# Patient Record
Sex: Female | Born: 1940 | Race: White | Hispanic: No | Marital: Married | Smoking: Former smoker
Health system: Southern US, Community
[De-identification: ages and names within clinical notes are randomized; demographics above are authoritative.]

## PROBLEM LIST (undated history)

## (undated) DIAGNOSIS — N3090 Cystitis, unspecified without hematuria: Secondary | ICD-10-CM

## (undated) DIAGNOSIS — K2270 Barrett's esophagus without dysplasia: Secondary | ICD-10-CM

## (undated) DIAGNOSIS — Z1389 Encounter for screening for other disorder: Secondary | ICD-10-CM

## (undated) DIAGNOSIS — M810 Age-related osteoporosis without current pathological fracture: Secondary | ICD-10-CM

## (undated) DIAGNOSIS — J449 Chronic obstructive pulmonary disease, unspecified: Secondary | ICD-10-CM

## (undated) DIAGNOSIS — M5126 Other intervertebral disc displacement, lumbar region: Secondary | ICD-10-CM

## (undated) DIAGNOSIS — K219 Gastro-esophageal reflux disease without esophagitis: Secondary | ICD-10-CM

## (undated) DIAGNOSIS — J45909 Unspecified asthma, uncomplicated: Secondary | ICD-10-CM

## (undated) DIAGNOSIS — I10 Essential (primary) hypertension: Secondary | ICD-10-CM

## (undated) DIAGNOSIS — Z1239 Encounter for other screening for malignant neoplasm of breast: Secondary | ICD-10-CM

## (undated) DIAGNOSIS — M51369 Other intervertebral disc degeneration, lumbar region without mention of lumbar back pain or lower extremity pain: Secondary | ICD-10-CM

## (undated) DIAGNOSIS — M419 Scoliosis, unspecified: Secondary | ICD-10-CM

## (undated) DIAGNOSIS — M5136 Other intervertebral disc degeneration, lumbar region: Secondary | ICD-10-CM

## (undated) DIAGNOSIS — D235 Other benign neoplasm of skin of trunk: Secondary | ICD-10-CM

## (undated) DIAGNOSIS — L409 Psoriasis, unspecified: Secondary | ICD-10-CM

## (undated) DIAGNOSIS — G4730 Sleep apnea, unspecified: Secondary | ICD-10-CM

## (undated) DIAGNOSIS — S83209A Unspecified tear of unspecified meniscus, current injury, unspecified knee, initial encounter: Secondary | ICD-10-CM

## (undated) DIAGNOSIS — M1990 Unspecified osteoarthritis, unspecified site: Secondary | ICD-10-CM

## (undated) HISTORY — DX: Age-related osteoporosis without current pathological fracture: M81.0

## (undated) HISTORY — DX: Scoliosis, unspecified: M41.9

## (undated) HISTORY — DX: Encounter for other screening for malignant neoplasm of breast: Z12.39

## (undated) HISTORY — DX: Chronic obstructive pulmonary disease, unspecified: J44.9

## (undated) HISTORY — DX: Unspecified asthma, uncomplicated: J45.909

## (undated) HISTORY — PX: COLONOSCOPY: SHX174

## (undated) HISTORY — DX: Other intervertebral disc degeneration, lumbar region without mention of lumbar back pain or lower extremity pain: M51.369

## (undated) HISTORY — DX: Psoriasis, unspecified: L40.9

## (undated) HISTORY — DX: Gastro-esophageal reflux disease without esophagitis: K21.9

## (undated) HISTORY — DX: Other intervertebral disc degeneration, lumbar region: M51.36

## (undated) HISTORY — DX: Unspecified osteoarthritis, unspecified site: M19.90

## (undated) HISTORY — DX: Other intervertebral disc displacement, lumbar region: M51.26

## (undated) HISTORY — DX: Cystitis, unspecified without hematuria: N30.90

## (undated) HISTORY — DX: Sleep apnea, unspecified: G47.30

## (undated) HISTORY — DX: Other benign neoplasm of skin of trunk: D23.5

## (undated) HISTORY — DX: Barrett's esophagus without dysplasia: K22.70

## (undated) HISTORY — PX: TOTAL ABDOMINAL HYSTERECTOMY: SHX209

## (undated) HISTORY — DX: Morbid (severe) obesity due to excess calories: E66.01

## (undated) HISTORY — DX: Essential (primary) hypertension: I10

## (undated) HISTORY — DX: Unspecified tear of unspecified meniscus, current injury, unspecified knee, initial encounter: S83.209A

## (undated) HISTORY — PX: TONSILLECTOMY: SUR1361

## (undated) HISTORY — PX: FOOT SURGERY: SHX648

## (undated) HISTORY — DX: Encounter for screening for other disorder: Z13.89

---

## 1994-02-22 HISTORY — PX: ABDOMINAL HYSTERECTOMY: SHX81

## 1994-02-22 HISTORY — PX: SALPINGOOPHORECTOMY: SHX82

## 1996-02-23 HISTORY — PX: BREAST BIOPSY: SHX20

## 2003-12-17 ENCOUNTER — Ambulatory Visit: Payer: Self-pay | Admitting: Pain Medicine

## 2004-01-13 ENCOUNTER — Ambulatory Visit: Payer: Self-pay | Admitting: Pain Medicine

## 2004-02-10 ENCOUNTER — Ambulatory Visit: Payer: Self-pay | Admitting: Internal Medicine

## 2004-02-25 ENCOUNTER — Ambulatory Visit: Payer: Self-pay | Admitting: Pain Medicine

## 2004-02-27 ENCOUNTER — Ambulatory Visit: Payer: Self-pay | Admitting: Pain Medicine

## 2004-03-02 ENCOUNTER — Ambulatory Visit: Payer: Self-pay | Admitting: Pain Medicine

## 2004-03-04 ENCOUNTER — Ambulatory Visit: Payer: Self-pay | Admitting: Pain Medicine

## 2004-03-19 ENCOUNTER — Ambulatory Visit: Payer: Self-pay | Admitting: Internal Medicine

## 2004-03-24 ENCOUNTER — Ambulatory Visit: Payer: Self-pay | Admitting: Pain Medicine

## 2004-03-25 ENCOUNTER — Ambulatory Visit: Payer: Self-pay | Admitting: Internal Medicine

## 2004-04-01 ENCOUNTER — Ambulatory Visit: Payer: Self-pay | Admitting: Pain Medicine

## 2004-04-01 ENCOUNTER — Ambulatory Visit: Payer: Self-pay | Admitting: Unknown Physician Specialty

## 2004-04-06 ENCOUNTER — Ambulatory Visit: Payer: Self-pay | Admitting: Unknown Physician Specialty

## 2004-04-13 ENCOUNTER — Ambulatory Visit: Payer: Self-pay | Admitting: Unknown Physician Specialty

## 2004-04-22 ENCOUNTER — Ambulatory Visit: Payer: Self-pay | Admitting: Internal Medicine

## 2004-04-27 ENCOUNTER — Ambulatory Visit: Payer: Self-pay | Admitting: General Surgery

## 2004-04-30 ENCOUNTER — Ambulatory Visit: Payer: Self-pay | Admitting: Pain Medicine

## 2004-04-30 ENCOUNTER — Ambulatory Visit: Payer: Self-pay | Admitting: General Surgery

## 2004-05-04 ENCOUNTER — Ambulatory Visit: Payer: Self-pay | Admitting: Pain Medicine

## 2004-05-28 ENCOUNTER — Ambulatory Visit: Payer: Self-pay | Admitting: Pain Medicine

## 2004-06-25 ENCOUNTER — Ambulatory Visit: Payer: Self-pay | Admitting: Pain Medicine

## 2004-07-23 ENCOUNTER — Ambulatory Visit: Payer: Self-pay | Admitting: Pain Medicine

## 2004-08-10 ENCOUNTER — Ambulatory Visit: Payer: Self-pay | Admitting: Pain Medicine

## 2004-09-17 ENCOUNTER — Ambulatory Visit: Payer: Self-pay | Admitting: Pain Medicine

## 2004-10-13 ENCOUNTER — Ambulatory Visit: Payer: Self-pay | Admitting: Ophthalmology

## 2004-10-15 ENCOUNTER — Ambulatory Visit: Payer: Self-pay | Admitting: Pain Medicine

## 2004-10-21 ENCOUNTER — Ambulatory Visit: Payer: Self-pay | Admitting: Ophthalmology

## 2004-11-19 ENCOUNTER — Ambulatory Visit: Payer: Self-pay | Admitting: Pain Medicine

## 2004-11-24 ENCOUNTER — Ambulatory Visit: Payer: Self-pay | Admitting: Ophthalmology

## 2004-12-02 ENCOUNTER — Ambulatory Visit: Payer: Self-pay | Admitting: Ophthalmology

## 2004-12-17 ENCOUNTER — Ambulatory Visit: Payer: Self-pay | Admitting: Pain Medicine

## 2005-01-18 ENCOUNTER — Ambulatory Visit: Payer: Self-pay | Admitting: Internal Medicine

## 2005-01-26 ENCOUNTER — Ambulatory Visit: Payer: Self-pay | Admitting: Pain Medicine

## 2005-01-28 ENCOUNTER — Ambulatory Visit: Payer: Self-pay | Admitting: General Surgery

## 2005-03-15 ENCOUNTER — Ambulatory Visit: Payer: Self-pay | Admitting: Pain Medicine

## 2005-04-13 ENCOUNTER — Ambulatory Visit: Payer: Self-pay | Admitting: Pain Medicine

## 2005-04-19 ENCOUNTER — Ambulatory Visit: Payer: Self-pay | Admitting: Pain Medicine

## 2005-04-27 ENCOUNTER — Ambulatory Visit: Payer: Self-pay | Admitting: General Surgery

## 2005-05-11 ENCOUNTER — Ambulatory Visit: Payer: Self-pay | Admitting: Pain Medicine

## 2005-07-08 ENCOUNTER — Ambulatory Visit: Payer: Self-pay | Admitting: Pain Medicine

## 2005-08-05 ENCOUNTER — Ambulatory Visit: Payer: Self-pay | Admitting: Pain Medicine

## 2005-08-10 ENCOUNTER — Encounter: Payer: Self-pay | Admitting: Pain Medicine

## 2005-08-16 ENCOUNTER — Ambulatory Visit: Payer: Self-pay | Admitting: Pain Medicine

## 2005-08-22 ENCOUNTER — Encounter: Payer: Self-pay | Admitting: Pain Medicine

## 2005-08-31 ENCOUNTER — Ambulatory Visit: Payer: Self-pay | Admitting: Pain Medicine

## 2005-09-24 ENCOUNTER — Encounter: Payer: Self-pay | Admitting: Pain Medicine

## 2005-09-28 ENCOUNTER — Ambulatory Visit: Payer: Self-pay | Admitting: Pain Medicine

## 2005-10-26 ENCOUNTER — Ambulatory Visit: Payer: Self-pay | Admitting: Pain Medicine

## 2005-11-03 ENCOUNTER — Ambulatory Visit: Payer: Self-pay | Admitting: Pain Medicine

## 2005-12-02 ENCOUNTER — Ambulatory Visit: Payer: Self-pay | Admitting: Pain Medicine

## 2005-12-13 ENCOUNTER — Ambulatory Visit: Payer: Self-pay | Admitting: Pain Medicine

## 2006-01-18 ENCOUNTER — Ambulatory Visit: Payer: Self-pay | Admitting: Pain Medicine

## 2006-01-31 ENCOUNTER — Ambulatory Visit: Payer: Self-pay | Admitting: Pain Medicine

## 2006-03-17 ENCOUNTER — Ambulatory Visit: Payer: Self-pay | Admitting: Pain Medicine

## 2006-04-05 ENCOUNTER — Ambulatory Visit: Payer: Self-pay | Admitting: Pain Medicine

## 2006-04-25 ENCOUNTER — Ambulatory Visit: Payer: Self-pay | Admitting: Pain Medicine

## 2006-06-28 ENCOUNTER — Ambulatory Visit: Payer: Self-pay | Admitting: Pain Medicine

## 2006-07-11 ENCOUNTER — Ambulatory Visit: Payer: Self-pay | Admitting: Pain Medicine

## 2006-08-02 ENCOUNTER — Ambulatory Visit: Payer: Self-pay | Admitting: Pain Medicine

## 2006-08-10 ENCOUNTER — Ambulatory Visit: Payer: Self-pay | Admitting: Pain Medicine

## 2006-08-22 ENCOUNTER — Ambulatory Visit: Payer: Self-pay | Admitting: General Surgery

## 2006-09-15 ENCOUNTER — Ambulatory Visit: Payer: Self-pay | Admitting: Pain Medicine

## 2006-09-21 ENCOUNTER — Ambulatory Visit: Payer: Self-pay | Admitting: Pain Medicine

## 2006-09-24 ENCOUNTER — Ambulatory Visit: Payer: Self-pay | Admitting: Pain Medicine

## 2006-09-26 ENCOUNTER — Ambulatory Visit: Payer: Self-pay | Admitting: Pain Medicine

## 2006-10-18 ENCOUNTER — Ambulatory Visit: Payer: Self-pay | Admitting: Pain Medicine

## 2006-11-16 ENCOUNTER — Ambulatory Visit: Payer: Self-pay | Admitting: Pain Medicine

## 2006-11-30 ENCOUNTER — Ambulatory Visit: Payer: Self-pay | Admitting: Psychiatry

## 2006-12-13 ENCOUNTER — Ambulatory Visit: Payer: Self-pay | Admitting: Pain Medicine

## 2006-12-26 ENCOUNTER — Ambulatory Visit: Payer: Self-pay | Admitting: Pain Medicine

## 2007-02-07 ENCOUNTER — Ambulatory Visit: Payer: Self-pay | Admitting: Pain Medicine

## 2007-03-01 ENCOUNTER — Ambulatory Visit: Payer: Self-pay | Admitting: Pain Medicine

## 2007-04-06 ENCOUNTER — Ambulatory Visit: Payer: Self-pay | Admitting: Pain Medicine

## 2007-05-01 ENCOUNTER — Ambulatory Visit: Payer: Self-pay | Admitting: Pain Medicine

## 2007-05-30 ENCOUNTER — Ambulatory Visit: Payer: Self-pay | Admitting: Pain Medicine

## 2007-06-21 ENCOUNTER — Ambulatory Visit: Payer: Self-pay | Admitting: Pain Medicine

## 2007-07-27 ENCOUNTER — Ambulatory Visit: Payer: Self-pay | Admitting: Pain Medicine

## 2007-08-17 ENCOUNTER — Ambulatory Visit: Payer: Self-pay | Admitting: Unknown Physician Specialty

## 2007-08-28 ENCOUNTER — Ambulatory Visit: Payer: Self-pay | Admitting: Pain Medicine

## 2007-10-03 ENCOUNTER — Ambulatory Visit: Payer: Self-pay | Admitting: Pain Medicine

## 2007-10-16 ENCOUNTER — Ambulatory Visit: Payer: Self-pay | Admitting: Pain Medicine

## 2007-10-31 ENCOUNTER — Ambulatory Visit: Payer: Self-pay | Admitting: Pain Medicine

## 2007-12-07 ENCOUNTER — Ambulatory Visit: Payer: Self-pay | Admitting: Internal Medicine

## 2007-12-26 ENCOUNTER — Ambulatory Visit: Payer: Self-pay | Admitting: Pain Medicine

## 2008-01-08 ENCOUNTER — Ambulatory Visit: Payer: Self-pay | Admitting: Pain Medicine

## 2008-01-25 ENCOUNTER — Ambulatory Visit: Payer: Self-pay | Admitting: Pain Medicine

## 2008-02-20 ENCOUNTER — Ambulatory Visit: Payer: Self-pay | Admitting: Pain Medicine

## 2008-02-20 ENCOUNTER — Ambulatory Visit: Payer: Self-pay | Admitting: Neurology

## 2008-02-29 ENCOUNTER — Ambulatory Visit: Payer: Self-pay | Admitting: Neurology

## 2008-03-21 ENCOUNTER — Ambulatory Visit: Payer: Self-pay | Admitting: Pain Medicine

## 2008-04-18 ENCOUNTER — Ambulatory Visit: Payer: Self-pay | Admitting: Pain Medicine

## 2008-05-09 ENCOUNTER — Ambulatory Visit: Payer: Self-pay | Admitting: Neurology

## 2008-05-21 ENCOUNTER — Ambulatory Visit: Payer: Self-pay | Admitting: Pain Medicine

## 2008-05-29 ENCOUNTER — Ambulatory Visit: Payer: Self-pay | Admitting: Pain Medicine

## 2008-06-13 ENCOUNTER — Ambulatory Visit: Payer: Self-pay | Admitting: Neurology

## 2008-06-15 ENCOUNTER — Ambulatory Visit: Payer: Self-pay | Admitting: Unknown Physician Specialty

## 2008-06-18 ENCOUNTER — Ambulatory Visit: Payer: Self-pay | Admitting: Pain Medicine

## 2008-06-24 ENCOUNTER — Ambulatory Visit: Payer: Self-pay | Admitting: Pain Medicine

## 2008-07-12 ENCOUNTER — Ambulatory Visit: Payer: Self-pay | Admitting: General Surgery

## 2008-07-18 ENCOUNTER — Ambulatory Visit: Payer: Self-pay | Admitting: Pain Medicine

## 2008-07-19 ENCOUNTER — Ambulatory Visit: Payer: Self-pay | Admitting: General Surgery

## 2008-08-15 ENCOUNTER — Ambulatory Visit: Payer: Self-pay | Admitting: Pain Medicine

## 2008-09-02 ENCOUNTER — Ambulatory Visit: Payer: Self-pay | Admitting: Pain Medicine

## 2008-11-05 ENCOUNTER — Ambulatory Visit: Payer: Self-pay | Admitting: Pain Medicine

## 2008-12-05 ENCOUNTER — Ambulatory Visit: Payer: Self-pay | Admitting: Pain Medicine

## 2008-12-10 ENCOUNTER — Emergency Department: Payer: Self-pay | Admitting: Emergency Medicine

## 2008-12-31 ENCOUNTER — Ambulatory Visit: Payer: Self-pay | Admitting: Pain Medicine

## 2009-01-06 ENCOUNTER — Ambulatory Visit: Payer: Self-pay | Admitting: Pain Medicine

## 2009-01-29 ENCOUNTER — Ambulatory Visit: Payer: Self-pay | Admitting: Pain Medicine

## 2009-01-30 ENCOUNTER — Ambulatory Visit: Payer: Self-pay

## 2009-02-18 ENCOUNTER — Encounter: Payer: Self-pay | Admitting: Physician Assistant

## 2009-03-11 ENCOUNTER — Ambulatory Visit: Payer: Self-pay | Admitting: Pain Medicine

## 2009-03-14 ENCOUNTER — Ambulatory Visit: Payer: Self-pay | Admitting: General Surgery

## 2009-04-23 ENCOUNTER — Ambulatory Visit: Payer: Self-pay | Admitting: Pain Medicine

## 2009-05-08 ENCOUNTER — Ambulatory Visit: Payer: Self-pay | Admitting: Cardiovascular Disease

## 2009-06-17 ENCOUNTER — Ambulatory Visit: Payer: Self-pay | Admitting: Pain Medicine

## 2009-07-22 ENCOUNTER — Ambulatory Visit: Payer: Self-pay | Admitting: Pain Medicine

## 2009-08-06 ENCOUNTER — Ambulatory Visit: Payer: Self-pay | Admitting: Internal Medicine

## 2009-09-09 ENCOUNTER — Ambulatory Visit: Payer: Self-pay | Admitting: Pain Medicine

## 2009-10-07 ENCOUNTER — Ambulatory Visit: Payer: Self-pay | Admitting: Pain Medicine

## 2009-10-10 ENCOUNTER — Ambulatory Visit: Payer: Self-pay | Admitting: General Surgery

## 2009-11-05 ENCOUNTER — Ambulatory Visit: Payer: Self-pay | Admitting: Pain Medicine

## 2009-12-23 ENCOUNTER — Ambulatory Visit: Payer: Self-pay | Admitting: Pain Medicine

## 2010-01-22 ENCOUNTER — Ambulatory Visit: Payer: Self-pay | Admitting: Pain Medicine

## 2010-02-22 HISTORY — PX: UPPER GI ENDOSCOPY: SHX6162

## 2010-02-24 ENCOUNTER — Ambulatory Visit: Payer: Self-pay | Admitting: Pain Medicine

## 2010-02-24 ENCOUNTER — Ambulatory Visit: Payer: Self-pay | Admitting: General Surgery

## 2010-03-26 ENCOUNTER — Ambulatory Visit: Payer: Self-pay | Admitting: Pain Medicine

## 2010-04-23 ENCOUNTER — Ambulatory Visit: Payer: Self-pay | Admitting: Pain Medicine

## 2010-05-28 ENCOUNTER — Ambulatory Visit: Payer: Self-pay | Admitting: Pain Medicine

## 2010-06-25 ENCOUNTER — Ambulatory Visit: Payer: Self-pay | Admitting: Pain Medicine

## 2010-07-23 ENCOUNTER — Ambulatory Visit: Payer: Self-pay | Admitting: Pain Medicine

## 2010-08-25 ENCOUNTER — Ambulatory Visit: Payer: Self-pay | Admitting: Pain Medicine

## 2010-09-02 ENCOUNTER — Ambulatory Visit: Payer: Self-pay | Admitting: Pain Medicine

## 2010-09-29 ENCOUNTER — Ambulatory Visit: Payer: Self-pay | Admitting: Pain Medicine

## 2010-10-27 ENCOUNTER — Ambulatory Visit: Payer: Self-pay | Admitting: Pain Medicine

## 2010-11-26 ENCOUNTER — Ambulatory Visit: Payer: Self-pay | Admitting: Pain Medicine

## 2010-12-02 ENCOUNTER — Ambulatory Visit: Payer: Self-pay | Admitting: Gastroenterology

## 2010-12-04 LAB — PATHOLOGY REPORT

## 2010-12-24 ENCOUNTER — Ambulatory Visit: Payer: Self-pay | Admitting: Pain Medicine

## 2011-01-26 ENCOUNTER — Ambulatory Visit: Payer: Self-pay | Admitting: Pain Medicine

## 2011-02-25 ENCOUNTER — Ambulatory Visit: Payer: Self-pay | Admitting: Pain Medicine

## 2011-04-05 ENCOUNTER — Ambulatory Visit: Payer: Self-pay | Admitting: Pain Medicine

## 2011-04-14 ENCOUNTER — Ambulatory Visit: Payer: Self-pay | Admitting: Gastroenterology

## 2011-04-16 LAB — PATHOLOGY REPORT

## 2011-05-11 ENCOUNTER — Ambulatory Visit: Payer: Self-pay | Admitting: Pain Medicine

## 2011-05-14 ENCOUNTER — Ambulatory Visit: Payer: Self-pay | Admitting: General Surgery

## 2011-06-10 ENCOUNTER — Ambulatory Visit: Payer: Self-pay | Admitting: Pain Medicine

## 2011-06-28 ENCOUNTER — Ambulatory Visit: Payer: Self-pay | Admitting: Pain Medicine

## 2011-07-26 ENCOUNTER — Ambulatory Visit: Payer: Self-pay | Admitting: Pain Medicine

## 2011-08-10 ENCOUNTER — Ambulatory Visit: Payer: Self-pay | Admitting: Pain Medicine

## 2011-09-08 ENCOUNTER — Ambulatory Visit: Payer: Self-pay | Admitting: Pain Medicine

## 2011-09-15 ENCOUNTER — Ambulatory Visit: Payer: Self-pay | Admitting: Bariatrics

## 2011-09-15 DIAGNOSIS — I10 Essential (primary) hypertension: Secondary | ICD-10-CM

## 2011-09-15 LAB — CBC WITH DIFFERENTIAL/PLATELET
Basophil #: 0.1 10*3/uL (ref 0.0–0.1)
Eosinophil #: 0.3 10*3/uL (ref 0.0–0.7)
Eosinophil %: 2.5 %
HGB: 12.7 g/dL (ref 12.0–16.0)
Lymphocyte #: 1.7 10*3/uL (ref 1.0–3.6)
MCH: 30.5 pg (ref 26.0–34.0)
MCV: 97 fL (ref 80–100)
Monocyte #: 0.8 x10 3/mm (ref 0.2–0.9)
Monocyte %: 7.8 %
Neutrophil #: 7.2 10*3/uL — ABNORMAL HIGH (ref 1.4–6.5)
Platelet: 336 10*3/uL (ref 150–440)
RBC: 4.18 10*6/uL (ref 3.80–5.20)
WBC: 10.1 10*3/uL (ref 3.6–11.0)

## 2011-09-15 LAB — PROTIME-INR
INR: 0.8
Prothrombin Time: 11.3 secs — ABNORMAL LOW (ref 11.5–14.7)

## 2011-09-15 LAB — COMPREHENSIVE METABOLIC PANEL
Albumin: 3.6 g/dL (ref 3.4–5.0)
Anion Gap: 10 (ref 7–16)
BUN: 17 mg/dL (ref 7–18)
Calcium, Total: 9.1 mg/dL (ref 8.5–10.1)
Chloride: 95 mmol/L — ABNORMAL LOW (ref 98–107)
EGFR (African American): >60
EGFR (Non-African Amer.): >60
Glucose: 116 mg/dL — ABNORMAL HIGH (ref 65–99)
Osmolality: 265 (ref 275–301)
Potassium: 4.4 mmol/L (ref 3.5–5.1)
SGOT(AST): 23 U/L (ref 15–37)
SGPT (ALT): 28 U/L
Total Protein: 7.3 g/dL (ref 6.4–8.2)

## 2011-09-15 LAB — PHOSPHORUS: Phosphorus: 3.5 mg/dL (ref 2.5–4.9)

## 2011-09-15 LAB — FERRITIN: Ferritin (ARMC): 14 ng/mL (ref 8–388)

## 2011-09-15 LAB — MAGNESIUM: Magnesium: 1.9 mg/dL

## 2011-09-15 LAB — AMYLASE: Amylase: 42 U/L (ref 25–115)

## 2011-09-15 LAB — HEMOGLOBIN A1C: Hemoglobin A1C: 6.3 % (ref 4.2–6.3)

## 2011-09-15 LAB — TSH: Thyroid Stimulating Horm: 1.27 u[IU]/mL

## 2011-09-15 LAB — IRON AND TIBC
Iron Saturation: 24 %
Unbound Iron-Bind.Cap.: 303 ug/dL

## 2011-09-15 LAB — LIPASE, BLOOD: Lipase: 70 U/L — ABNORMAL LOW (ref 73–393)

## 2011-10-14 ENCOUNTER — Ambulatory Visit: Payer: Self-pay | Admitting: Pain Medicine

## 2011-10-20 ENCOUNTER — Ambulatory Visit: Payer: Self-pay | Admitting: Pain Medicine

## 2011-10-21 ENCOUNTER — Ambulatory Visit: Payer: Self-pay | Admitting: Bariatrics

## 2011-10-24 ENCOUNTER — Ambulatory Visit: Payer: Self-pay | Admitting: Bariatrics

## 2011-11-11 ENCOUNTER — Ambulatory Visit: Payer: Self-pay | Admitting: Pain Medicine

## 2011-12-01 ENCOUNTER — Ambulatory Visit: Payer: Self-pay | Admitting: Pain Medicine

## 2012-01-12 ENCOUNTER — Ambulatory Visit: Payer: Self-pay | Admitting: Pain Medicine

## 2012-02-02 ENCOUNTER — Ambulatory Visit: Payer: Self-pay | Admitting: Pain Medicine

## 2012-02-23 DIAGNOSIS — S83209A Unspecified tear of unspecified meniscus, current injury, unspecified knee, initial encounter: Secondary | ICD-10-CM

## 2012-02-23 HISTORY — DX: Unspecified tear of unspecified meniscus, current injury, unspecified knee, initial encounter: S83.209A

## 2012-03-02 ENCOUNTER — Ambulatory Visit: Payer: Self-pay | Admitting: Pain Medicine

## 2012-03-09 ENCOUNTER — Ambulatory Visit: Payer: Self-pay | Admitting: Pain Medicine

## 2012-03-12 ENCOUNTER — Emergency Department: Payer: Self-pay | Admitting: Internal Medicine

## 2012-03-31 ENCOUNTER — Encounter: Payer: Self-pay | Admitting: General Surgery

## 2012-04-11 ENCOUNTER — Ambulatory Visit: Payer: Self-pay | Admitting: Pain Medicine

## 2012-05-11 ENCOUNTER — Ambulatory Visit: Payer: Self-pay | Admitting: Pain Medicine

## 2012-05-25 ENCOUNTER — Ambulatory Visit: Payer: Self-pay | Admitting: General Surgery

## 2012-05-29 ENCOUNTER — Encounter: Payer: Self-pay | Admitting: General Surgery

## 2012-06-08 ENCOUNTER — Ambulatory Visit (INDEPENDENT_AMBULATORY_CARE_PROVIDER_SITE_OTHER): Payer: Medicare Other | Admitting: General Surgery

## 2012-06-08 ENCOUNTER — Ambulatory Visit: Payer: Self-pay | Admitting: Pain Medicine

## 2012-06-08 ENCOUNTER — Other Ambulatory Visit: Payer: Self-pay | Admitting: *Deleted

## 2012-06-08 ENCOUNTER — Encounter: Payer: Self-pay | Admitting: General Surgery

## 2012-06-08 VITALS — BP 112/80 | HR 78 | Resp 14 | Ht <= 58 in | Wt 190.0 lb

## 2012-06-08 DIAGNOSIS — Z1231 Encounter for screening mammogram for malignant neoplasm of breast: Secondary | ICD-10-CM

## 2012-06-08 DIAGNOSIS — Z1239 Encounter for other screening for malignant neoplasm of breast: Secondary | ICD-10-CM

## 2012-06-08 DIAGNOSIS — N63 Unspecified lump in unspecified breast: Secondary | ICD-10-CM

## 2012-06-08 NOTE — Patient Instructions (Signed)
Continue to do self breast exams Call for any new changes in the breast.

## 2012-06-08 NOTE — Progress Notes (Signed)
Patient ID: Emily Kemp, female   DOB: 1941-01-30, 72 y.o.   MRN: [id number]  Chief Complaint  Patient presents with  . Follow-up    mammogram    HPI Emily Kemp is a 72 y.o. female.  Patient here today for follow up mammogram.  She has a known history of bilateral stereotatic breast biopsies 09-21-96 that were benign. States she did have a vaginal biopsy last week and has not gotten the report as of yet.  No new breast issues. Patient remains in her wheelchair for visit.  HPI  Past Medical History  Diagnosis Date  . Benign neoplasm of skin of trunk, except scrotum   . Hypertension   . Barrett esophagus   . Asthma   . Osteoporosis   . Cystitis   . Arthritis   . Morbid obesity   . Breast screening, unspecified   . GERD (gastroesophageal reflux disease)   . Screening for obesity     Past Surgical History  Procedure Laterality Date  . Colonoscopy  2009, 2013    Dr. Niel Hummer  . Breast biopsy  1998, 2007  . Abdominal hysterectomy  1996  . Salpingoophorectomy  1996  . Upper gi endoscopy  2012    No family history on file.  Social History History  Substance Use Topics  . Smoking status: Former Smoker    Quit date: 02/22/1990  . Smokeless tobacco: Not on file  . Alcohol Use: Yes    Allergies  Allergen Reactions  . Wellbutrin (Bupropion) Hives  . Latex Rash  . Paxil (Paroxetine Hcl) Rash  . Sulfa Antibiotics Rash  . Tape Rash    Current Outpatient Prescriptions  Medication Sig Dispense Refill  . amLODipine-valsartan (EXFORGE) 10-320 MG per tablet Take 1 tablet by mouth daily.      Marland Kitchen aspirin 325 MG tablet Take 325 mg by mouth daily.      Marland Kitchen atorvastatin (LIPITOR) 20 MG tablet Take 20 mg by mouth daily.      . bisacodyl (DULCOLAX) 10 MG suppository Place 10 mg rectally as needed for constipation.      . celecoxib (CELEBREX) 100 MG capsule Take 100 mg by mouth as needed for pain.      . cloNIDine (CATAPRES) 0.1 MG tablet Take 0.1 mg by mouth 2 (two) times daily.       . DULoxetine (CYMBALTA) 30 MG capsule Take 30 mg by mouth daily with lunch.      . DULoxetine (CYMBALTA) 60 MG capsule Take 60 mg by mouth daily.      Marland Kitchen esomeprazole (NEXIUM) 40 MG capsule Take 40 mg by mouth 2 (two) times daily.      Marland Kitchen estradiol (ESTRACE) 1 MG tablet Take 1 mg by mouth daily.      Marland Kitchen lamoTRIgine (LAMICTAL) 200 MG tablet Take 200 mg by mouth 2 (two) times daily.      . Linaclotide (LINZESS PO) Take by mouth.      Marland Kitchen LORazepam (ATIVAN) 1 MG tablet Take 1 mg by mouth daily.      . metFORMIN (GLUCOPHAGE) 500 MG tablet Take 500 mg by mouth 2 (two) times daily with a meal.       . methadone (DOLOPHINE) 5 MG tablet Take 5 mg by mouth every 6 (six) hours as needed for pain.      . methocarbamol (ROBAXIN) 750 MG tablet Take 750 mg by mouth 4 (four) times daily.      . metoprolol (LOPRESSOR) 50 MG tablet  Take 50 mg by mouth 2 (two) times daily.      . polyethylene glycol (MIRALAX / GLYCOLAX) packet Take 17 g by mouth as needed.      . pramipexole (MIRAPEX) 0.25 MG tablet Take 0.25 mg by mouth as needed.      . pregabalin (LYRICA) 25 MG capsule Take 25 mg by mouth as needed.      . promethazine (PHENERGAN) 25 MG tablet Take 25 mg by mouth as needed.      . traZODone (DESYREL) 100 MG tablet Take 100 mg by mouth 2 (two) times daily.      Marland Kitchen HYDROcodone-acetaminophen (NORCO) 7.5-325 MG per tablet        No current facility-administered medications for this visit.    Review of Systems Review of Systems  Constitutional: Negative.   Respiratory: Negative.   Cardiovascular: Negative.     Blood pressure 112/80, pulse 78, resp. rate 14, height 4\' 10"  (1.473 m), weight 190 lb (86.183 kg).  Physical Exam Physical Exam  Constitutional: She is oriented to person, place, and time. She appears well-developed.  Cardiovascular: Normal rate and regular rhythm.   Pulmonary/Chest: Effort normal and breath sounds normal. Right breast exhibits no inverted nipple, no mass, no nipple discharge,  no skin change and no tenderness. Left breast exhibits no inverted nipple, no mass, no nipple discharge, no skin change and no tenderness.  Neurological: She is alert and oriented to person, place, and time.  Skin: Skin is warm and dry.    Data Reviewed Mammograms dated May 13, 2012 reviewed. Stable post biopsy changes. BIRAD 2.   Assessment    Benign breast exam     Plan    The patient desires to continue having annual mammograms and clinical breast exam to arrange this office. She return in one year for reassessment.          Earline Mayotte 06/08/2012, 10:16 AM

## 2012-06-08 NOTE — Progress Notes (Signed)
Patient will be asked to return to the office in one year for a bilateral screening mammogram. 

## 2012-06-21 ENCOUNTER — Ambulatory Visit: Payer: Self-pay | Admitting: Pain Medicine

## 2012-07-07 ENCOUNTER — Ambulatory Visit: Payer: Self-pay | Admitting: Pain Medicine

## 2012-07-13 ENCOUNTER — Ambulatory Visit: Payer: Self-pay | Admitting: Pain Medicine

## 2012-07-20 DIAGNOSIS — R350 Frequency of micturition: Secondary | ICD-10-CM | POA: Insufficient documentation

## 2012-07-20 DIAGNOSIS — N3941 Urge incontinence: Secondary | ICD-10-CM | POA: Insufficient documentation

## 2012-07-20 DIAGNOSIS — R339 Retention of urine, unspecified: Secondary | ICD-10-CM | POA: Insufficient documentation

## 2012-07-20 DIAGNOSIS — N8110 Cystocele, unspecified: Secondary | ICD-10-CM | POA: Insufficient documentation

## 2012-08-17 ENCOUNTER — Ambulatory Visit: Payer: Self-pay | Admitting: Pain Medicine

## 2012-09-19 ENCOUNTER — Ambulatory Visit: Payer: Self-pay | Admitting: Pain Medicine

## 2012-10-04 ENCOUNTER — Ambulatory Visit: Payer: Self-pay | Admitting: Pain Medicine

## 2012-10-24 ENCOUNTER — Ambulatory Visit: Payer: Self-pay | Admitting: Pain Medicine

## 2012-11-01 ENCOUNTER — Ambulatory Visit: Payer: Self-pay | Admitting: Pain Medicine

## 2012-11-23 ENCOUNTER — Ambulatory Visit: Payer: Self-pay | Admitting: Pain Medicine

## 2012-12-01 DIAGNOSIS — N318 Other neuromuscular dysfunction of bladder: Secondary | ICD-10-CM | POA: Insufficient documentation

## 2012-12-18 ENCOUNTER — Ambulatory Visit: Payer: Self-pay | Admitting: Pain Medicine

## 2013-01-01 ENCOUNTER — Ambulatory Visit: Payer: Self-pay | Admitting: Pain Medicine

## 2013-01-12 LAB — URINALYSIS, COMPLETE: RBC,UR: 56453 /HPF (ref 0–5)

## 2013-01-12 LAB — CBC WITH DIFFERENTIAL/PLATELET
Basophil %: 0.3 %
Eosinophil #: 0.2 10*3/uL (ref 0.0–0.7)
HGB: 13.3 g/dL (ref 12.0–16.0)
Lymphocyte #: 2.9 10*3/uL (ref 1.0–3.6)
MCH: 31.6 pg (ref 26.0–34.0)
MCHC: 33 g/dL (ref 32.0–36.0)
MCV: 96 fL (ref 80–100)
RDW: 13.2 % (ref 11.5–14.5)

## 2013-01-12 LAB — COMPREHENSIVE METABOLIC PANEL
BUN: 14 mg/dL (ref 7–18)
Chloride: 97 mmol/L — ABNORMAL LOW (ref 98–107)
Co2: 25 mmol/L (ref 21–32)
Creatinine: 0.78 mg/dL (ref 0.60–1.30)
EGFR (African American): >60
Glucose: 169 mg/dL — ABNORMAL HIGH (ref 65–99)
Osmolality: 267 (ref 275–301)
SGPT (ALT): 28 U/L (ref 12–78)
Sodium: 131 mmol/L — ABNORMAL LOW (ref 136–145)

## 2013-01-13 ENCOUNTER — Observation Stay: Payer: Self-pay | Admitting: Urology

## 2013-01-13 ENCOUNTER — Ambulatory Visit: Payer: Self-pay | Admitting: Urology

## 2013-01-13 LAB — BASIC METABOLIC PANEL
Anion Gap: 6 — ABNORMAL LOW (ref 7–16)
BUN: 12 mg/dL (ref 7–18)
Chloride: 94 mmol/L — ABNORMAL LOW (ref 98–107)
Co2: 28 mmol/L (ref 21–32)
Creatinine: 0.69 mg/dL (ref 0.60–1.30)
EGFR (African American): >60
EGFR (Non-African Amer.): >60
Glucose: 167 mg/dL — ABNORMAL HIGH (ref 65–99)
Osmolality: 261 (ref 275–301)
Potassium: 3.6 mmol/L (ref 3.5–5.1)

## 2013-01-13 LAB — CBC WITH DIFFERENTIAL/PLATELET
Basophil #: 0.1 10*3/uL (ref 0.0–0.1)
Basophil %: 0.7 %
Eosinophil #: 0.1 10*3/uL (ref 0.0–0.7)
HCT: 37.1 % (ref 35.0–47.0)
Lymphocyte #: 2.2 10*3/uL (ref 1.0–3.6)
MCHC: 33.2 g/dL (ref 32.0–36.0)
Monocyte #: 1.1 x10 3/mm — ABNORMAL HIGH (ref 0.2–0.9)
Monocyte %: 5.5 %
Neutrophil #: 15.8 10*3/uL — ABNORMAL HIGH (ref 1.4–6.5)
Platelet: 356 10*3/uL (ref 150–440)

## 2013-01-14 LAB — CBC WITH DIFFERENTIAL/PLATELET
Basophil %: 0.3 %
HGB: 9.9 g/dL — ABNORMAL LOW (ref 12.0–16.0)
Lymphocyte #: 2.4 10*3/uL (ref 1.0–3.6)
Monocyte %: 6.8 %
RDW: 13.6 % (ref 11.5–14.5)
WBC: 13.3 10*3/uL — ABNORMAL HIGH (ref 3.6–11.0)

## 2013-01-15 LAB — URINE CULTURE

## 2013-01-23 DIAGNOSIS — R5381 Other malaise: Secondary | ICD-10-CM | POA: Insufficient documentation

## 2013-01-23 DIAGNOSIS — IMO0002 Reserved for concepts with insufficient information to code with codable children: Secondary | ICD-10-CM | POA: Insufficient documentation

## 2013-01-28 ENCOUNTER — Emergency Department: Payer: Self-pay | Admitting: Emergency Medicine

## 2013-01-28 LAB — URINALYSIS, COMPLETE
Bacteria: NONE SEEN
Bilirubin,UR: NEGATIVE
Blood: NEGATIVE
Glucose,UR: NEGATIVE mg/dL (ref 0–75)
Ketone: NEGATIVE
Nitrite: NEGATIVE
Ph: 5 (ref 4.5–8.0)
Protein: NEGATIVE
RBC,UR: NONE SEEN /HPF (ref 0–5)
Squamous Epithelial: <1
WBC UR: <1 /HPF (ref 0–5)

## 2013-01-28 LAB — DRUG SCREEN, URINE
Amphetamines, Ur Screen: NEGATIVE (ref ?–1000)
Barbiturates, Ur Screen: NEGATIVE (ref ?–200)
Benzodiazepine, Ur Scrn: NEGATIVE (ref ?–200)
Methadone, Ur Screen: POSITIVE (ref ?–300)
Opiate, Ur Screen: POSITIVE (ref ?–300)
Phencyclidine (PCP) Ur S: NEGATIVE (ref ?–25)
Tricyclic, Ur Screen: NEGATIVE (ref ?–1000)

## 2013-01-28 LAB — COMPREHENSIVE METABOLIC PANEL
Albumin: 3.7 g/dL (ref 3.4–5.0)
Alkaline Phosphatase: 75 U/L
BUN: 15 mg/dL (ref 7–18)
Bilirubin,Total: 0.3 mg/dL (ref 0.2–1.0)
Chloride: 97 mmol/L — ABNORMAL LOW (ref 98–107)
Co2: 30 mmol/L (ref 21–32)
Creatinine: 0.74 mg/dL (ref 0.60–1.30)
EGFR (African American): >60
Osmolality: 268 (ref 275–301)
Potassium: 4.6 mmol/L (ref 3.5–5.1)
SGOT(AST): 19 U/L (ref 15–37)
Sodium: 132 mmol/L — ABNORMAL LOW (ref 136–145)
Total Protein: 7.1 g/dL (ref 6.4–8.2)

## 2013-01-28 LAB — CBC
HCT: 28.4 % — ABNORMAL LOW (ref 35.0–47.0)
MCV: 94 fL (ref 80–100)
Platelet: 409 10*3/uL (ref 150–440)
RBC: 3.01 10*6/uL — ABNORMAL LOW (ref 3.80–5.20)
RDW: 14.3 % (ref 11.5–14.5)

## 2013-01-28 LAB — LIPASE, BLOOD: Lipase: 694 U/L — ABNORMAL HIGH (ref 73–393)

## 2013-02-08 ENCOUNTER — Ambulatory Visit: Payer: Self-pay | Admitting: Pain Medicine

## 2013-03-05 ENCOUNTER — Ambulatory Visit: Payer: Self-pay | Admitting: Pain Medicine

## 2013-04-04 ENCOUNTER — Ambulatory Visit: Payer: Self-pay | Admitting: Pain Medicine

## 2013-05-10 ENCOUNTER — Ambulatory Visit: Payer: Self-pay | Admitting: Pain Medicine

## 2013-05-14 DIAGNOSIS — M545 Low back pain, unspecified: Secondary | ICD-10-CM | POA: Insufficient documentation

## 2013-05-14 DIAGNOSIS — M5137 Other intervertebral disc degeneration, lumbosacral region: Secondary | ICD-10-CM | POA: Insufficient documentation

## 2013-05-14 DIAGNOSIS — B351 Tinea unguium: Secondary | ICD-10-CM | POA: Insufficient documentation

## 2013-06-05 ENCOUNTER — Ambulatory Visit: Payer: Medicare Other | Admitting: General Surgery

## 2013-06-14 ENCOUNTER — Ambulatory Visit: Payer: Self-pay | Admitting: Pain Medicine

## 2013-06-28 DIAGNOSIS — R262 Difficulty in walking, not elsewhere classified: Secondary | ICD-10-CM | POA: Insufficient documentation

## 2013-06-28 DIAGNOSIS — E669 Obesity, unspecified: Secondary | ICD-10-CM | POA: Insufficient documentation

## 2013-06-28 DIAGNOSIS — M79604 Pain in right leg: Secondary | ICD-10-CM | POA: Insufficient documentation

## 2013-06-28 DIAGNOSIS — G4733 Obstructive sleep apnea (adult) (pediatric): Secondary | ICD-10-CM | POA: Insufficient documentation

## 2013-06-28 DIAGNOSIS — R200 Anesthesia of skin: Secondary | ICD-10-CM | POA: Insufficient documentation

## 2013-06-29 ENCOUNTER — Ambulatory Visit: Payer: Self-pay | Admitting: General Surgery

## 2013-07-02 ENCOUNTER — Encounter: Payer: Self-pay | Admitting: General Surgery

## 2013-07-10 ENCOUNTER — Encounter: Payer: Self-pay | Admitting: General Surgery

## 2013-07-10 ENCOUNTER — Ambulatory Visit: Payer: Self-pay | Admitting: General Surgery

## 2013-07-12 ENCOUNTER — Encounter: Payer: Self-pay | Admitting: General Surgery

## 2013-07-12 ENCOUNTER — Ambulatory Visit: Payer: Self-pay | Admitting: General Surgery

## 2013-07-12 ENCOUNTER — Telehealth: Payer: Self-pay | Admitting: General Surgery

## 2013-07-12 ENCOUNTER — Ambulatory Visit (INDEPENDENT_AMBULATORY_CARE_PROVIDER_SITE_OTHER): Payer: Medicare Other | Admitting: General Surgery

## 2013-07-12 VITALS — BP 120/74 | HR 74 | Resp 16 | Wt 196.0 lb

## 2013-07-12 DIAGNOSIS — R928 Other abnormal and inconclusive findings on diagnostic imaging of breast: Secondary | ICD-10-CM

## 2013-07-12 DIAGNOSIS — K429 Umbilical hernia without obstruction or gangrene: Secondary | ICD-10-CM

## 2013-07-12 DIAGNOSIS — R921 Mammographic calcification found on diagnostic imaging of breast: Secondary | ICD-10-CM

## 2013-07-12 DIAGNOSIS — M7989 Other specified soft tissue disorders: Secondary | ICD-10-CM

## 2013-07-12 NOTE — Telephone Encounter (Signed)
Ultrasound of RLE showed clot in peroneal vein.  Popliteal and proximal veins normal. Discussed informally w/ vascular surgery. No indication for anticoagulation. ? Benefit of recheck in 4-7 days with repeat ultrasound considering long duration of symtoms.  Patient encouraged to call if progressive leg swelling. Follow up otherwise in one year for breast check.

## 2013-07-12 NOTE — Progress Notes (Signed)
Patient ID: Emily Kemp, female   DOB: 30-Mar-1940, 73 y.o.   MRN: [id number]  Chief Complaint  Patient presents with  . Follow-up    mammogram    HPI Emily Kemp is a 73 y.o. female.  who presents for her follow up breast evaluation. The most recent mammogram was done on 06-29-13.  Patient does not perform regular self breast checks, however, she gets regular mammograms done. No new breast issues. States she is having more knee pain over the past 1-2 months. History of torn meniscus last year and wearing a knee brace. The patient reports he has appreciated more swelling in the right calf for last 3-4 months. She was concerned about the possibility of a deep venous thrombosis with her increasing posterior knee discomfort.  Incidentally she reported an umbilical hernia. This is rarely symptomatic, but may have increased gradually in size over the past year.  HPI  Past Medical History  Diagnosis Date  . Benign neoplasm of skin of trunk, except scrotum   . Hypertension   . Barrett esophagus   . Asthma   . Osteoporosis   . Cystitis   . Arthritis   . Morbid obesity   . Breast screening, unspecified   . GERD (gastroesophageal reflux disease)   . Screening for obesity   . Psoriasis   . Torn meniscus 2014    right knee    Past Surgical History  Procedure Laterality Date  . Colonoscopy  2009, 2013    Dr. Dionne Milo  . Breast biopsy  1998, 2007  . Abdominal hysterectomy  1996  . Salpingoophorectomy  1996  . Upper gi endoscopy  2012    No family history on file.  Social History History  Substance Use Topics  . Smoking status: Former Smoker    Quit date: 02/22/1990  . Smokeless tobacco: Not on file  . Alcohol Use: Yes    Allergies  Allergen Reactions  . Wellbutrin [Bupropion] Hives  . Latex Rash  . Paxil [Paroxetine Hcl] Rash  . Sulfa Antibiotics Rash  . Tape Rash    Current Outpatient Prescriptions  Medication Sig Dispense Refill  . amLODipine-valsartan  (EXFORGE) 10-320 MG per tablet Take 1 tablet by mouth daily.      Marland Kitchen aspirin 325 MG tablet Take 325 mg by mouth daily.      Marland Kitchen atorvastatin (LIPITOR) 20 MG tablet Take 20 mg by mouth daily.      . cloNIDine (CATAPRES) 0.1 MG tablet Take 0.1 mg by mouth 2 (two) times daily.      . DULoxetine (CYMBALTA) 60 MG capsule Take 60 mg by mouth 2 (two) times daily.       Marland Kitchen esomeprazole (NEXIUM) 40 MG capsule Take 40 mg by mouth 2 (two) times daily.      Marland Kitchen estradiol (ESTRACE) 1 MG tablet Take 1 mg by mouth daily.      . fexofenadine (ALLEGRA) 180 MG tablet Take 180 mg by mouth daily.      Marland Kitchen L-Methylfolate-B12-B6-B2 (CEREFOLIN PO) Take 1 tablet by mouth daily.      Marland Kitchen lamoTRIgine (LAMICTAL) 200 MG tablet Take 200 mg by mouth 2 (two) times daily.      Marland Kitchen LORazepam (ATIVAN) 1 MG tablet Take 1 mg by mouth daily.      . metFORMIN (GLUCOPHAGE) 500 MG tablet Take 500 mg by mouth 2 (two) times daily with a meal. 2 at supper      . methadone (DOLOPHINE) 5 MG tablet Take  5 mg by mouth every 6 (six) hours as needed for pain.      . methocarbamol (ROBAXIN) 750 MG tablet Take 750 mg by mouth at bedtime.       . metoprolol (LOPRESSOR) 50 MG tablet Take 50 mg by mouth 2 (two) times daily.      . pramipexole (MIRAPEX) 0.25 MG tablet Take 0.25 mg by mouth as needed.      . pregabalin (LYRICA) 25 MG capsule Take 25 mg by mouth as needed.      . traZODone (DESYREL) 100 MG tablet Take 300 mg by mouth at bedtime.       . celecoxib (CELEBREX) 100 MG capsule Take 100 mg by mouth as needed for pain.      Marland Kitchen HYDROcodone-acetaminophen (NORCO) 7.5-325 MG per tablet 1 tablet every 6 (six) hours as needed.        No current facility-administered medications for this visit.    Review of Systems Review of Systems  Constitutional: Negative.   Respiratory: Negative.   Cardiovascular: Negative.     Blood pressure 120/74, pulse 74, resp. rate 16, weight 196 lb (88.905 kg).  Physical Exam Physical Exam  Constitutional: She is  oriented to person, place, and time. She appears well-developed and well-nourished.  Neck: Neck supple.  Cardiovascular: Normal rate, regular rhythm and normal heart sounds.   Pulmonary/Chest: Effort normal and breath sounds normal. Right breast exhibits no inverted nipple, no mass, no nipple discharge, no skin change and no tenderness. Left breast exhibits no inverted nipple, no mass, no nipple discharge, no skin change and no tenderness.  Abdominal:    Musculoskeletal:  Right knee without significant swelling, but mild tenderness along the tibial plateau.   Calf circumference 10 cm below the tibial tubercle: Right calf measures 37, left calf 36.  Thighs are symmetrical and visual inspection and palpation. There  Lymphadenopathy:    She has no cervical adenopathy.    She has no axillary adenopathy.       Right: No inguinal adenopathy present.       Left: No inguinal adenopathy present.  Neurological: She is alert and oriented to person, place, and time.  Skin: Skin is warm and dry.    Data Reviewed Screening mammogram dated May, 2015 suggested calcifications in the right breast for which additional views were recommended. BI-RAD-0.  Focal spot compression views dated 07/10/2013 showed loss stable calcifications but a suggestion that summer now arranged in a linear configuration. This was not possible to repeat in multiple projections. A six-month followup was recommended. BI-RAD 3.  2013 -2015 mammograms were reviewed in conjunction with today's visit.  Venous ultrasound obtained after the patient had left the office showed evidence of clot in the right peroneal vein without involvement of the tibial peroneal trunk or popliteal vein.  Assessment    microcalcifications, likely benign. Six-month followup required.  Mild right calf swelling with long-standing knee brace use, possible DVT.  Umbilical hernia, minimally symptomatic.    Plan    The patient reports that she had an  umbilical hernia repaired under local anesthesia in the 73s. Should she undergo repeat repair, she would require general anesthesia.  Focal clot in the peroneal vein does not require anticoagulation. Considering her report of swelling been present for 2 months and unchanged during that time, the likelihood of clot propagation is felt to be very small and a followup study is not felt to be indicated unless there is a change in her clinical exam.  Arrangements were made for followup mammogram of the right breast in 6 months.      Discussed risk and benefits of umbilical hernia surgery.  Patient is scheduled for a right lower extremity ultrasound at [facility] now. The patient will report to the registration desk when she arrives.   PCP: Noni Saupe Byrnett 07/13/2013, 9:18 AM

## 2013-07-12 NOTE — Patient Instructions (Addendum)
Continue self breast exams. Call office for any new breast issues or concerns.  Patient is scheduled for a right lower extremity ultrasound at [facility] now. The patient will report to the registration desk when she arrives.

## 2013-07-13 DIAGNOSIS — M7989 Other specified soft tissue disorders: Secondary | ICD-10-CM | POA: Insufficient documentation

## 2013-07-13 DIAGNOSIS — K429 Umbilical hernia without obstruction or gangrene: Secondary | ICD-10-CM | POA: Insufficient documentation

## 2013-07-13 DIAGNOSIS — R921 Mammographic calcification found on diagnostic imaging of breast: Secondary | ICD-10-CM | POA: Insufficient documentation

## 2013-08-03 ENCOUNTER — Ambulatory Visit: Payer: Self-pay | Admitting: Neurology

## 2013-08-22 ENCOUNTER — Ambulatory Visit: Payer: Self-pay | Admitting: Pain Medicine

## 2013-09-24 ENCOUNTER — Ambulatory Visit: Payer: Self-pay | Admitting: Pain Medicine

## 2013-10-01 ENCOUNTER — Ambulatory Visit: Payer: Self-pay | Admitting: Pain Medicine

## 2013-10-23 ENCOUNTER — Ambulatory Visit: Payer: Self-pay | Admitting: Pain Medicine

## 2013-10-25 ENCOUNTER — Ambulatory Visit: Payer: No Typology Code available for payment source | Admitting: General Surgery

## 2013-11-22 ENCOUNTER — Ambulatory Visit: Payer: Self-pay | Admitting: Pain Medicine

## 2013-12-19 ENCOUNTER — Telehealth: Payer: Self-pay | Admitting: *Deleted

## 2013-12-19 ENCOUNTER — Ambulatory Visit: Payer: Self-pay | Admitting: Vascular Surgery

## 2013-12-19 LAB — URINALYSIS, COMPLETE
Bilirubin,UR: NEGATIVE
Blood: NEGATIVE
Glucose,UR: NEGATIVE mg/dL (ref 0–75)
KETONE: NEGATIVE
LEUKOCYTE ESTERASE: NEGATIVE
Nitrite: NEGATIVE
PROTEIN: NEGATIVE
Ph: 6 (ref 4.5–8.0)
SPECIFIC GRAVITY: 1.02 (ref 1.003–1.030)
Squamous Epithelial: 1

## 2013-12-19 LAB — BASIC METABOLIC PANEL
Anion Gap: 8 (ref 7–16)
BUN: 17 mg/dL (ref 7–18)
Calcium, Total: 8.9 mg/dL (ref 8.5–10.1)
Chloride: 102 mmol/L (ref 98–107)
Co2: 28 mmol/L (ref 21–32)
Creatinine: 0.8 mg/dL (ref 0.60–1.30)
EGFR (African American): >60
EGFR (Non-African Amer.): >60
Glucose: 175 mg/dL — ABNORMAL HIGH (ref 65–99)
Osmolality: 281 (ref 275–301)
Potassium: 4.5 mmol/L (ref 3.5–5.1)
Sodium: 138 mmol/L (ref 136–145)

## 2013-12-19 LAB — CBC WITH DIFFERENTIAL/PLATELET
BASOS PCT: 0.6 %
Basophil #: 0.1 10*3/uL (ref 0.0–0.1)
EOS ABS: 0.2 10*3/uL (ref 0.0–0.7)
Eosinophil %: 2.1 %
HCT: 37.7 % (ref 35.0–47.0)
HGB: 12.4 g/dL (ref 12.0–16.0)
Lymphocyte #: 1.6 10*3/uL (ref 1.0–3.6)
Lymphocyte %: 18.2 %
MCH: 30.5 pg (ref 26.0–34.0)
MCHC: 32.8 g/dL (ref 32.0–36.0)
MCV: 93 fL (ref 80–100)
MONOS PCT: 7.2 %
Monocyte #: 0.6 x10 3/mm (ref 0.2–0.9)
Neutrophil #: 6.4 10*3/uL (ref 1.4–6.5)
Neutrophil %: 71.9 %
PLATELETS: 348 10*3/uL (ref 150–440)
RBC: 4.06 10*6/uL (ref 3.80–5.20)
RDW: 14.2 % (ref 11.5–14.5)
WBC: 8.9 10*3/uL (ref 3.6–11.0)

## 2013-12-19 LAB — PROTIME-INR
INR: 0.9
PROTHROMBIN TIME: 12.3 s (ref 11.5–14.7)

## 2013-12-19 NOTE — Telephone Encounter (Signed)
She went to her PCP and [facility] and nothing was found to be an issue. She is also having carotid surgery by Dr Delana Meyer Nov 6. She is sorry she missed her Sept appt.

## 2013-12-20 ENCOUNTER — Ambulatory Visit: Payer: Self-pay | Admitting: Pain Medicine

## 2013-12-23 HISTORY — PX: OTHER SURGICAL HISTORY: SHX169

## 2013-12-24 ENCOUNTER — Encounter: Payer: Self-pay | Admitting: General Surgery

## 2013-12-28 ENCOUNTER — Inpatient Hospital Stay: Payer: Self-pay | Admitting: Vascular Surgery

## 2013-12-29 LAB — BASIC METABOLIC PANEL
ANION GAP: 9 (ref 7–16)
BUN: 13 mg/dL (ref 7–18)
CO2: 23 mmol/L (ref 21–32)
Calcium, Total: 8.1 mg/dL — ABNORMAL LOW (ref 8.5–10.1)
Chloride: 106 mmol/L (ref 98–107)
Creatinine: 0.72 mg/dL (ref 0.60–1.30)
EGFR (African American): >60
EGFR (Non-African Amer.): >60
Glucose: 199 mg/dL — ABNORMAL HIGH (ref 65–99)
Osmolality: 281 (ref 275–301)
Potassium: 4.3 mmol/L (ref 3.5–5.1)
Sodium: 138 mmol/L (ref 136–145)

## 2013-12-29 LAB — CBC WITH DIFFERENTIAL/PLATELET
Basophil #: 0.1 10*3/uL (ref 0.0–0.1)
Basophil %: 0.7 %
EOS PCT: 0 %
Eosinophil #: 0 10*3/uL (ref 0.0–0.7)
HCT: 31.4 % — ABNORMAL LOW (ref 35.0–47.0)
HGB: 10.1 g/dL — AB (ref 12.0–16.0)
LYMPHS ABS: 1.8 10*3/uL (ref 1.0–3.6)
Lymphocyte %: 11.1 %
MCH: 30.2 pg (ref 26.0–34.0)
MCHC: 32.1 g/dL (ref 32.0–36.0)
MCV: 94 fL (ref 80–100)
Monocyte #: 1.5 x10 3/mm — ABNORMAL HIGH (ref 0.2–0.9)
Monocyte %: 9.1 %
NEUTROS ABS: 12.6 10*3/uL — AB (ref 1.4–6.5)
Neutrophil %: 79.1 %
Platelet: 324 10*3/uL (ref 150–440)
RBC: 3.33 10*6/uL — ABNORMAL LOW (ref 3.80–5.20)
RDW: 14.4 % (ref 11.5–14.5)
WBC: 16 10*3/uL — ABNORMAL HIGH (ref 3.6–11.0)

## 2013-12-29 LAB — PROTIME-INR
INR: 1
PROTHROMBIN TIME: 13.2 s (ref 11.5–14.7)

## 2013-12-29 LAB — APTT: ACTIVATED PTT: 23 s — AB (ref 23.6–35.9)

## 2014-01-21 ENCOUNTER — Ambulatory Visit: Payer: Self-pay | Admitting: Pain Medicine

## 2014-01-22 ENCOUNTER — Ambulatory Visit: Payer: No Typology Code available for payment source | Admitting: General Surgery

## 2014-01-25 ENCOUNTER — Ambulatory Visit: Payer: Self-pay | Admitting: General Surgery

## 2014-01-28 ENCOUNTER — Encounter: Payer: Self-pay | Admitting: General Surgery

## 2014-02-07 ENCOUNTER — Ambulatory Visit: Payer: No Typology Code available for payment source | Admitting: General Surgery

## 2014-02-19 ENCOUNTER — Ambulatory Visit: Payer: Self-pay | Admitting: Pain Medicine

## 2014-03-07 ENCOUNTER — Ambulatory Visit: Payer: No Typology Code available for payment source | Admitting: General Surgery

## 2014-03-21 ENCOUNTER — Ambulatory Visit: Payer: Self-pay | Admitting: Pain Medicine

## 2014-04-03 ENCOUNTER — Ambulatory Visit: Payer: Self-pay | Admitting: Pain Medicine

## 2014-04-18 ENCOUNTER — Ambulatory Visit: Payer: Self-pay | Admitting: Pain Medicine

## 2014-04-22 ENCOUNTER — Encounter: Payer: Self-pay | Admitting: General Surgery

## 2014-04-22 ENCOUNTER — Ambulatory Visit (INDEPENDENT_AMBULATORY_CARE_PROVIDER_SITE_OTHER): Payer: Medicare Other | Admitting: General Surgery

## 2014-04-22 VITALS — BP 132/76 | HR 74 | Resp 16 | Ht <= 58 in | Wt 183.0 lb

## 2014-04-22 DIAGNOSIS — R921 Mammographic calcification found on diagnostic imaging of breast: Secondary | ICD-10-CM

## 2014-04-22 DIAGNOSIS — R928 Other abnormal and inconclusive findings on diagnostic imaging of breast: Secondary | ICD-10-CM

## 2014-04-22 NOTE — Progress Notes (Signed)
Patient ID: Emily Kemp, female   DOB: 01-Jun-1940, 74 y.o.   MRN: [id number]  Chief Complaint  Patient presents with  . Follow-up    mammogram    HPI Emily Kemp is a 74 y.o. female who presents for a breast evaluation. The most recent right breast  mammogram was done on 01/25/14 .  Patient does perform regular self breast checks and gets regular mammograms done.    HPI  Past Medical History  Diagnosis Date  . Benign neoplasm of skin of trunk, except scrotum   . Hypertension   . Barrett esophagus   . Asthma   . Osteoporosis   . Cystitis   . Arthritis   . Morbid obesity   . Breast screening, unspecified   . GERD (gastroesophageal reflux disease)   . Screening for obesity   . Psoriasis   . Torn meniscus 2014    right knee    Past Surgical History  Procedure Laterality Date  . Colonoscopy  2009, 2013    Dr. Dionne Milo  . Breast biopsy  1998, 2007  . Abdominal hysterectomy  1996  . Salpingoophorectomy  1996  . Upper gi endoscopy  2012  . Carotid artery surgery  12/2013    No family history on file.  Social History History  Substance Use Topics  . Smoking status: Former Smoker    Quit date: 02/22/1990  . Smokeless tobacco: Not on file  . Alcohol Use: Yes    Allergies  Allergen Reactions  . Wellbutrin [Bupropion] Hives  . Latex Rash  . Paxil [Paroxetine Hcl] Rash  . Sulfa Antibiotics Rash  . Tape Rash    Current Outpatient Prescriptions  Medication Sig Dispense Refill  . amLODipine-valsartan (EXFORGE) 10-320 MG per tablet Take 1 tablet by mouth daily.    Marland Kitchen aspirin 325 MG tablet Take 325 mg by mouth daily.    Marland Kitchen atorvastatin (LIPITOR) 20 MG tablet Take 20 mg by mouth daily.    . celecoxib (CELEBREX) 100 MG capsule Take 100 mg by mouth as needed for pain.    . cloNIDine (CATAPRES) 0.1 MG tablet Take 0.1 mg by mouth 2 (two) times daily.    . DULoxetine (CYMBALTA) 60 MG capsule Take 60 mg by mouth 2 (two) times daily.     Marland Kitchen esomeprazole (NEXIUM) 40 MG  capsule Take 40 mg by mouth 2 (two) times daily.    . fexofenadine (ALLEGRA) 180 MG tablet Take 180 mg by mouth daily.    Marland Kitchen HYDROcodone-acetaminophen (NORCO) 7.5-325 MG per tablet 1 tablet every 6 (six) hours as needed.     Marland Kitchen L-Methylfolate-B12-B6-B2 (CEREFOLIN PO) Take 1 tablet by mouth daily.    Marland Kitchen lamoTRIgine (LAMICTAL) 200 MG tablet Take 200 mg by mouth 2 (two) times daily.    Marland Kitchen LORazepam (ATIVAN) 1 MG tablet Take 1 mg by mouth daily.    . metFORMIN (GLUCOPHAGE) 500 MG tablet Take 500 mg by mouth 2 (two) times daily with a meal. 2 at supper    . methadone (DOLOPHINE) 5 MG tablet Take 5 mg by mouth every 6 (six) hours as needed for pain.    . methocarbamol (ROBAXIN) 750 MG tablet Take 750 mg by mouth at bedtime.     . metoprolol (LOPRESSOR) 50 MG tablet Take 50 mg by mouth 2 (two) times daily.    . pramipexole (MIRAPEX) 0.25 MG tablet Take 0.25 mg by mouth as needed.    . pregabalin (LYRICA) 25 MG capsule Take 25 mg by  mouth as needed.    . traZODone (DESYREL) 100 MG tablet Take 300 mg by mouth at bedtime.      No current facility-administered medications for this visit.    Review of Systems Review of Systems  Constitutional: Negative.   Respiratory: Negative.   Cardiovascular: Negative.     Blood pressure 132/76, pulse 74, resp. rate 16, height 4\' 10"  (1.473 m), weight 183 lb (83.008 kg).  Physical Exam Physical Exam  Constitutional: She is oriented to person, place, and time. She appears well-developed and well-nourished.  Eyes: Conjunctivae are normal. No scleral icterus.  Neck: Neck supple.  Cardiovascular: Normal rate, regular rhythm and normal heart sounds.   Pulmonary/Chest: Effort normal and breath sounds normal. Right breast exhibits no inverted nipple, no mass, no nipple discharge, no skin change and no tenderness. Left breast exhibits no inverted nipple, no mass, no nipple discharge, no skin change and no tenderness.  Exam was completed in the wheelchair due to the  patient's poor mobility.  Abdominal: Soft. Bowel sounds are normal. There is no tenderness.  Lymphadenopathy:    She has no cervical adenopathy.    She has no axillary adenopathy.  Neurological: She is alert and oriented to person, place, and time.  Skin: Skin is warm and dry.    Data Reviewed Diagnostic right breast mammogram dated 01/25/2014 showed stable or slightly decreased microcalcifications. BI-RADS-3.  Assessment    A 9 breast exam. No progression of microcalcifications.    Plan The patient has been asked to return to the office in four months (6 months after her last mammograms) with a bilateral diagnostic mammogram.      PCP:  Elijio Miles, S. Ahmed Robert Bellow 04/23/2014, 4:12 PM

## 2014-04-22 NOTE — Patient Instructions (Addendum)
The patient has been asked to return to the office in four months with a bilateral diagnostic mammogram.

## 2014-05-21 ENCOUNTER — Ambulatory Visit: Payer: Self-pay | Admitting: Pain Medicine

## 2014-06-14 NOTE — Consult Note (Signed)
Brief Consult Note: Diagnosis: Bipolar I disorder depressed.   Patient was seen by consultant.   Consult note dictated.   Recommend further assessment or treatment.   Orders entered.   Discussed with Attending MD.   Comments: Emily Kemp has a h/o bipolar illness on a combination of Cymbalta and Lamictal in the care of Dr. Bridgett Larsson. She has multiple, progressive, debilitating medical problems including chronic pain. She overdosed on Methadone. She very much regrets her act, wants to be alive, wants to be treated. She has a supportive husband who is eager to take her home asa the effects of overdose are over.  PLAN: 1. The patient is on IVC.   2. We are unable to admit to psychiatry here due to need for oxygen. It will be very difficult, if not impossible to find a bed i [facility]. I do not believe she needs to go to state hospital. I wiil discharge from ER once effects of methadone overdose remit.  3. She was restarted on her medications per Dr. Bridgett Larsson.  4. I will follow along.  Electronic Signatures: Orson Slick (MD)  (Signed 08-Dec-14 19:01)  Authored: Brief Consult Note   Last Updated: 08-Dec-14 19:01 by Orson Slick (MD)

## 2014-06-14 NOTE — Discharge Summary (Signed)
PATIENT NAME:  Emily Kemp, Emily Kemp MR#:  [id number] DATE OF BIRTH:  October 08, 1940  DATE OF ADMISSION:  01/13/2013 DATE OF DISCHARGE:  01/15/2013  PRINCIPAL DIAGNOSES: Hematuria, urinary retention.   PROCEDURES: None.   INDICATIONS: 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 She underwent bladder Botox injection. She developed subsequent hematuria and clot retention. She presented to the Emergency Room for further evaluation.   HOSPITAL COURSE: [name] underwent evaluation by Dr. Sharlette Dense on 01/13/2013 after presentation to the Emergency Room for pain and hematuria. She was found to be in clot retention with significant hematuria post bladder Botox injection. She was placed on continuous bladder irrigation. She had subsequent clearing of her urine on the continuous bladder irrigation. She did have pain and discomfort related to her bladder spasms. The pain was controlled with oral pain medication. She remained afebrile, vital signs stable throughout her hospitalization. Her bladder irrigation was discontinued on 01/15/2013. She was subsequently discharged to home with instructions to follow up as originally scheduled. She was discharged on her usual admission medications. She is to notify us if there are any further problems or questions in the interim.   ____________________________ Denice Bors Jacqlyn Larsen, MD bsc:sb D: 01/22/2013 08:59:14 ET T: 01/22/2013 09:21:41 ET JOB#: [id number]  cc: Denice Bors. Jacqlyn Larsen, MD, <Dictator> Denice Bors Sashay Felling MD ELECTRONICALLY SIGNED 01/23/2013 7:13

## 2014-06-14 NOTE — Consult Note (Signed)
PATIENT NAME:  TERSA, Emily Kemp MR#:  [id number] DATE OF BIRTH:  [birth year]  DATE OF CONSULTATION:  01/29/2013  REFERRING PHYSICIAN:  Hinda Kehr, MD  CONSULTING PHYSICIAN:  Venicia Vandall B. Neola Worrall, MD  REASON FOR CONSULTATION: To evaluate the patient after suicide attempt.   IDENTIFYING DATA: Ms. Seckman is a 74 year old female with history of bipolar disorder.   CHIEF COMPLAINT: "I want to talk to my husband."   HISTORY OF PRESENT ILLNESS: Ms. Sporrer has a long history of bipolar illness. She has been a patient of Dr. Bridgett Larsson for many years. She has been stable on a combination of Lamictal, Cymbalta and clonazepam. For the past 15 years, the patient has been increasingly sick with physical problems. A couple of weeks ago, she was hospitalized for new bladder problems. Became very anemic and had difficulties urinating. She has to get up to the bathroom multiple times during the day and night. She is exhausted, especially that she has other chronic conditions, including chronic pain and multiple joint problems that make ambulation very difficulty. In fact, she uses a motorized scooter at home. Per husband report, she is in continuous pain in spite of excellent care provided by Dr. Primus Bravo at the pain clinic. The husband, who is a very supportive, has been providing around-the-clock care and support. He is also in charge of her medications; however, her medicines are stored in the patient's bathroom. On the day of admission, the patient overdosed on prescribed methadone. The husband is not sure how many pills she has taken. He is surprised with her act as she has not been suicidal before, but he also understands that she has been suffering enormously. The patient admits that she took an overdose of methadone with the intention to die; however, she came to her senses and demanded to be taken to the hospital and wants everything possible to be done in order to keep her alive. She adamantly denies thoughts of hurting  herself or others now. Wants to get better and return to home. The husband is ready to take her home once the effects of methadone overdose are gone. He has been able to provide excellent care for his wife but seems tired himself. He would welcome home health care or a psychiatric nurse to help manage the patient's physical as well as mental problems, but the patient has been doing relatively well in spite of all difficulties. She has not been particularly depressed. She is not psychotic. There are no symptoms suggestive of bipolar mania. She is anxious most of the time and takes Klonopin. She denies having panic attacks but is anxious most of the day. Oftentimes, she has difficulty sleeping and is allowed to take extra clonazepam throughout the night as she needs it more so in the afternoon and at night rather than in the morning. She is an Therapist, sports, so she realizes the risks of combining narcotic pain killers and benzodiazepines. She has a very good relationship with Dr. Primus Bravo at the pain clinic, and it is quite likely that the patient nor her husband are very patient and do not insist on any changes. In spite of taking pain medication, the patient is still in pain. I wonder if some adjustments could be made. For example, some patients do better if methadone is prescribed 3 times a day instead of twice a day. She has Vicodin available for breakthrough pain, but it makes her very somnolent and she does not like taking it. It probably is especially true when combined  with benzodiazepines. There is no history of alcohol, illicit substance or prescription pill abuse.   PAST PSYCHIATRIC HISTORY: Many years of bipolar disorder, stable on medications. Apparently, she has never been hospitalized and denies any prior suicide attempts.   FAMILY PSYCHIATRIC HISTORY: Unknown.   PAST MEDICAL HISTORY: Dyslipidemia, anemia, degeneration of intravertebral disks, scoliosis, arthropathy, sleep apnea, diabetes, hypertension,  abdominal pain, status post bladder Botox injection complicated by subsequent hematuria and clot retention for history of chronic pain and detrusor instability.   ALLERGIES: CATAPRES, COGENTIN, LIDODERM, LITHIUM, NEOSPORIN, PAXIL, SULFA DRUGS, WELLBUTRIN, ADHESIVE TAPE.   MEDICATIONS ON ADMISSION: Methadone 5 mg every 8 hours as needed, Lyrica 25 mg twice daily, trazodone 300 mg at bedtime, atorvastatin 20 mg at bedtime, Ativan every 6 hours as needed for anxiety, amlodipine 10 mg daily, pantoprazole 40 mg twice daily, Levaquin 500 mg daily, Vicodin 7.5 mg 4 tablets daily as tolerated, lorazepam 1 mg at 1, bedtime, midnight and 3:00, CPAP with 2 liters of oxygen, clonidine 0.5 mg twice daily, Mirapex 1 mg daily, Phenergan 25 mg as needed, metformin 500 mg ER twice daily, estradiol 1 mg daily, Exforge 10/320 mg daily, metoprolol 50 mg twice daily, Lamictal 200 mg twice daily, aspirin 325 mg daily, Lipitor 20 mg daily, Linzess 290 mcg daily, Nexium 40 mg twice daily, Allegra daily, Cymbalta 60 mg daily, MiraLAX as needed, Flonase twice daily, methocarbamol 750 mg twice daily.   SOCIAL HISTORY: The patient used to work as a Mining engineer. She has been married for 52 years. For the past 15 years, she has been disabled. Her husband makes a comment that if there was money, he would have taken her to [facility].   REVIEW OF SYSTEMS:  CONSTITUTIONAL: Positive for fatigue and gradual weight gain.  EYES: No double or blurred vision.  ENT: No hearing loss.  RESPIRATORY: Positive for shortness of breath.  CARDIOVASCULAR: No chest pain or orthopnea.  GASTROINTESTINAL: Positive for abdominal pain and constipation.  GENITOURINARY: Positive for bladder instability at present with indwelling Foley.  ENDOCRINE: No heat or cold intolerance.  LYMPHATIC: No anemia or easy bruising.  INTEGUMENTARY: No acne or rash.  MUSCULOSKELETAL: Multiple problems with scoliosis and back problems as well as bilateral  shoulder problems, knee problems and diffuse arthritis.  NEUROLOGIC: No tingling or weakness but positive for sciatica on the left side.  PSYCHIATRIC: See history of present illness for details.   PHYSICAL EXAMINATION:  VITAL SIGNS: Blood pressure 164/77, pulse 110, respirations 18.  GENERAL: This is an obese female, visibly anxious.   The rest of her physical examination is deferred to her primary attending.   LABORATORY DATA: CHEMISTRIES: Blood glucose 152, BUN 15, creatinine 0.74, sodium 132, potassium 4.6. Lipase 694. LFTs within normal limits. Troponin less than 0.02. Urine tox screen is positive for opiates and methadone. CBC: White blood count 14.7, hemoglobin 9.3, hematocrit 28.4, platelets 409. Urinalysis is not suggestive of urinary tract infection.   EKG: Normal sinus rhythm, normal EKG.   MENTAL STATUS EXAMINATION: The patient is examined in the Emergency Room. She is alert, oriented to person and place. She is a very poor historian. She is very anxious. She is breathing oxygen. She is shaking violently. A part of it is familial tremor that runs in the family. She could possibly be withdrawing from benzodiazepines as she is prescribed Ativan, but she is not positive for benzos on urine tox screen. She also was given Narcan 3 times for her methadone overdose. She is in  a hospital bed, barely covered with a hospital gown, breathing oxygen. She maintains intense eye contact. She is drinking a lot of water during the interview. She is unable to focus on my questions and demands to have her husband in the room and to go home. She does state on numerous occasions that she changed her mind, no longer she wants to die. She wants to live. She wants to be treated. Wants to return home with her husband. She denies symptoms of depression. She does feel anxious and is asking for her Ativan. There are no psychotic symptoms. She does not have thoughts of hurting others. Her cognition is impaired. She  seems to still be confused from the overdose. Her insight and judgment are questionable.   DIAGNOSES:  AXIS I: Bipolar I disorder, depressed, anxiety disorder not otherwise specified, opioid dependence, benzodiazepine dependence.  AXIS II: Deferred.  AXIS III: Multiple medical problems.  AXIS IV: Chronic physical and mental illness, chronic pain.  AXIS V: Global assessment of functioning 45.   PLAN:  1. The patient is on IVC.  2. We were unable to admit this unfortunate patient to our psychiatric unit due to her need for oxygen. This is not allowed on the unit. It will very difficult, if not impossible, to find her a bed on a geropsychiatry unit. I do not believe that she needs to be admitted to a state hospital. It would take a long time for her to get there. She is very attached to her husband, and he wants her home. I believe that I will discharge this patient from the Emergency Room once effects of methadone overdose are over. We will attempt to provide additional help for the husband, home health or a psychiatric nurse to assist the family. The patient was restarted on all of her medications as prescribed by Dr. Bridgett Larsson. She will follow up with Dr. Bridgett Larsson in the community. I will follow along.    ____________________________ Wardell Honour. Bary Leriche, MD jbp:gb D: 01/29/2013 19:23:00 ET T: 01/29/2013 20:09:49 ET JOB#: [id number]  cc: Dacia Capers B. Bary Leriche, MD, <Dictator> Clovis Fredrickson MD ELECTRONICALLY SIGNED 02/20/2013 4:21

## 2014-06-14 NOTE — Consult Note (Signed)
Brief Consult Note: Diagnosis: Bipolar I disorder depressed., Opiate Dependence.   Patient was seen by consultant.   Recommend further assessment or treatment.   Orders entered.   Discussed with Attending MD.   Comments: Ms. Wedemeyer has a h/o bipolar illness on a combination of Cymbalta and Lamictal and was following  Dr. Bridgett Larsson. She has multiple, progressive, debilitating medical problems including chronic pain. She overdosed on Methadone. She appeared very tired this am and was unable to participate in the interview.   PLAN: 1. The patient is on IVC.  2.Will continue to monitor her closely and discharge once stable.  3. Will change Lorazepam 0.5mg  po TID as her UDS was negative.  4. She was restarted on her medications per Dr. Bridgett Larsson..  Electronic Signatures: Jeronimo Norma (MD)  (Signed 09-Dec-14 12:04)  Authored: Brief Consult Note   Last Updated: 09-Dec-14 12:04 by Jeronimo Norma (MD)

## 2014-06-14 NOTE — H&P (Signed)
Subjective/Chief Complaint gross hematuria   History of Present Illness 74y F with a h/o gross hematuria and bladder pain who recently underwent intravesical botox with Dr. Jacqlyn Larsen. She had her procedure yesterday and called Dr. Jacqlyn Larsen afterwards c/o significant bladder pain. In the ER, she had to have her catheter exchanged because it was clogging up with clots. She is having suprapubic and urethral pain that is burning in nature. Her methadone and hydrocodone are not controlling her discomfort. She denies fever, chills, n/v. No lightheadedness, dizziness, CP, or SOB. Her hematuria persists despite her catheter exchange and continuing flushing by the nursing staff.   Past Med/Surgical Hx:  Shingles:   Hypercholesterolemia:   herniated disc:   arthritis:   scoliosis:   Degenerative Disc Disease:   sleep apnea:   Hypertension:   Diabetes:   Foot Surgery - Left:   Breast Biiopsy bilateral:   Tonsillectomy:   Hysterectomy - Total:   ALLERGIES:  Lithium: Alt Ment Status  Lidoderm: Rash  Catapres-TTS-1: Rash  Neosporin: Rash  Latex: Swelling, Hives, Rash  Wellbutrin: Other  Paxil: Other  Cogentin: Other  Catapres: Other  Sulfa drugs: Other  Adhesive: Unknown  HOME MEDICATIONS: Medication Instructions Status  methadone 5 mg oral tablet  LIMIT 2 - 3   TABS PO/DAY  IF TOLERATED Active  acetaminophen-hydrocodone 325 mg-7.5 mg tablet LIMIT    2 - 4  TABS PO / DAY IF TOLERATED Active  lorazepam tablet 1 mg 1pm,1 at bedtime, 1 at midnight, 1 at 3 a.m. Active  cpap with 2 liters oxygen at night   once a day (at bedtime) during the night Active  clonidine 0.2 mg oral tablet 1   2 times a day  Active  Mirapex 0.25 mg oral tablet 1 tab(s)  once q other day as needed   Active  trazodone 100 mg oral tablet 3  at bedtime Active  Phenergan 25 mg oral tablet 1 tab(s) orally 2 times a day as needed   Active  metformin 500 mg oral tablet, extended release 1  orally 2 times a day  Active  estradiol 1  mg oral tablet 1 tab(s) orally once a day  Active  Exforge 10 mg-320 mg oral tablet 1 tab(s) orally once a day Active  metoprolol tartrate 50 mg oral tablet 1 tab(s) orally 2 times a day Active  Lamictal 200 mg oral tablet twice daily Active  aspirin 325 mg oral tablet 1 tab(s) orally once a day Active  Lipitor 20 mg oral tablet 1 tab(s) orally once a day (at bedtime) Active  Linzess 290 mcg oral capsule 1 cap(s) orally once a day, As Needed - for Constipation Active  NexIUM 40 mg oral delayed release capsule 1 cap(s) orally 2 times a day Active  Flonase 50 mcg/inh nasal spray 2 spray(s) nasal once a day Active  Allegra 1 tab(s) orally once a day Active  bisacodyl 10 mg rectal suppository 2 suppository(ies) rectal once a day, As Needed Active  Cymbalta 60 mg oral delayed release capsule 1 cap(s) orally once a day Active  desonide topical Apply topically to affected area once a day face for rash Active  ketoconazole 1 tab(s) orally 2 times a day Active  methocarbamol 500 mg oral tablet 1 tab(s) orally 4 times a day, As Needed Active  MiraLax - oral powder for reconstitution  orally once a day, As Needed Active   Family and Social History:  Family History Non-Contributory   Social History negative tobacco  Place of Living Home   Review of Systems:  Fever/Chills No   Cough No   Sputum No   Abdominal Pain Yes   Diarrhea No   Constipation No   Nausea/Vomiting No   SOB/DOE No   Chest Pain No   Telemetry Reviewed sinus tachy   Dysuria Yes   Medications/Allergies Reviewed Medications/Allergies reviewed   Physical Exam:  GEN well developed, well nourished, anxious   HEENT moist oral mucosa   NECK supple  No masses   RESP normal resp effort  clear BS   CARD tachycardic, regular rhythm   ABD normal BS  suprapubic tenderness   GU foley catheter in place  draining cherry red urine   EXTR negative cyanosis/clubbing, negative edema   SKIN skin turgor good    PSYCH alert, A+O to time, place, person, anxious   Lab Results: Routine UA:  21-Nov-14 20:06   Color (UA) RED  Clarity (UA) BLOODY  Glucose (UA) see comment  Bilirubin (UA) see comment  Ketones (UA) see comment  Specific Gravity (UA) 1.010  Blood (UA) see comment  pH (UA) see comment  Protein (UA) see comment  Nitrite (UA) SEE COMMENT  Leukocyte Esterase (UA) see comment  RBC (UA) 56453 /HPF  WBC (UA) 107 /HPF  Bacteria (UA) TRACE  Epithelial Cells (UA) NONE SEEN  Result(s) reported on 12 Jan 2013 at 08:37PM.  Routine Hem:  21-Nov-14 20:06   WBC (CBC)  22.9  Hemoglobin (CBC) 13.3  22-Nov-14 04:14   WBC (CBC)  19.3  Hemoglobin (CBC) 12.3   Radiology Results: CT:    21-Nov-14 23:20, CT Abdomen and Pelvis With Contrast  CT Abdomen and Pelvis With Contrast  REASON FOR EXAM:    (1) grose hematuria and discomfort and 22,000 wbc   count after pelvic botox; (2)  COMMENTS:       PROCEDURE: CT  - CT ABDOMEN / PELVIS  W  - Jan 12 2013 11:20PM     CLINICAL DATA:  Complains of pain to the bladder with gross  hematuria. Patient states she had pelvic botox today.    EXAM:  CT ABDOMEN AND PELVIS WITH CONTRAST    TECHNIQUE:  Multidetector CT imaging of the abdomen and pelvis was performed  using the standard protocol following bolus administration of  intravenous contrast.    CONTRAST:  125 mL Isovue 370    COMPARISON:  None.    FINDINGS:  There is diffuse fatty infiltration of liver. The liver is otherwise  normal. The spleen, pancreas, gallbladder, adrenal glands and right  kidney are normal. There is a 6 mm cyst in the lateral midpole left  kidney. There is no hydronephrosis bilaterally. There is  atherosclerosis of the abdominal aorta without aneurysmal  dilatation. There is no abdominal lymphadenopathy. There is a small  hiatal hernia. There is no small bowel obstruction or  diverticulitis. The appendix is normal.  There is stranding surrounding the bladder. There  is air in the  bladder consistent with recent instrumentation. There is blood clot  in the posterior bladder air. A Foley catheter isidentified within  the bladder. There is no pneumoperitoneum. There is no extravasation  of contrast from the bladder. There is mild atelectasis of the right  lung base. The left lung base is clear. Degenerative joint changes  of the spine are identified.     IMPRESSION:  No acute abnormality in the abdomen.    Blood clot and air within the bladder and stranding surrounding the  bladder consistent with recent procedure. There is no evidence of  free air or extravasation of contrast from the bladder.  Electronically Signed    By: Abelardo Diesel M.D.    On: 01/12/2013 23:31         Verified By: Abelardo Diesel, M.D.,    Assessment/Admission Diagnosis I personally reviewed her CT - shows no hydro, clot in posterior bladder, no significant stranding or fluid around bladder to suggest perforation or cystitis  I exchanged her foley catheter using sterile technique for a 35fr Rusch 3wy foley catheter and hand irrigated several small clots out of the bladder. The catheter was hooked to NS CBI and was running clear at a medium drip.   A: 74 y/o F with gross hematuria after botox to bladder with persistent hematuria   Plan - gross hematuria: will admit for CBI and serial hgbs. hold home ASA. no SubQ heparin given bleeding - leukocytosis: unclear etiology but perhaps from a cystitis from Botox so will empirically start levaquin - bladder spasms: add oxybutynin and B&Os. I discussed patient's allergies with her and she is okay with taking these - IVF at 8, consistent carb diet - SSI for DM - restart home medications for multiple medical problems - SCDs for DVT proph, hold heparin - dispo: floor   Electronic Signatures: Charna Archer (MD)  (Signed [phone] 09:58)  Authored: CHIEF COMPLAINT and HISTORY, PAST MEDICAL/SURGIAL HISTORY, ALLERGIES, HOME  MEDICATIONS, FAMILY AND SOCIAL HISTORY, REVIEW OF SYSTEMS, PHYSICAL EXAM, LABS, Radiology, ASSESSMENT AND PLAN   Last Updated: 22-Nov-14 09:58 by Charna Archer (MD)

## 2014-06-15 NOTE — Op Note (Signed)
PATIENT NAME:  Emily Kemp, Emily Kemp MR#:  [id number] DATE OF BIRTH:  [birth year]  DATE OF PROCEDURE:  12/28/2013  PREOPERATIVE DIAGNOSES: 1. 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  2. Extensive carotid redundancy.  3. Aphasia.   POSTOPERATIVE DIAGNOSES:  1. 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  2. Extensive carotid redundancy.  3. Aphasia.   PROCEDURE PERFORMED:  1. Right carotid endarterectomy.  2. Resection of 15 mm of the common carotid artery with primary end-to-end anastomosis.  3. Repair of arterial defect with Xenograft CorMatrix patch.  4. Ultrasound-guided insertion of arterial line.   SURGEON: Katha Cabal, MD.   ANESTHESIA: General by endotracheal intubation.   FLUIDS: Per anesthesia record.   ESTIMATED BLOOD LOSS: 200 mL.   SPECIMEN: 1. Segment of common carotid artery to pathology for permanent section.  2. Carotid plaque to pathology for permanent section.   INDICATIONS: Ms. Onofre is a 74 year old woman who experienced neurological changes and difficulty with her speech and was subsequently on workup found to have a critical stenosis of the right internal carotid artery associated with marked redundancy and kinking of the internal carotid artery. The risks and benefits for surgical repair, she is not a stent candidate given her tortuosity and kinking, were reviewed in detail. All questions answered. The patient agreed to proceed.   DESCRIPTION OF PROCEDURE: The patient was taken to the operating room and placed in the supine position. After adequate general anesthesia was induced, Foley was inserted. A second IV was placed. Subsequently, difficulty with obtaining arterial access was encountered and I was asked to evaluate the situation. Left wrist was evaluated with ultrasound. Radial artery was identified. It is echolucent and pulsatile indicating patency. Images were recorded and under real-time visualization,  a microneedle was inserted into the radial artery, microwire followed by microsheath. The sheath was then dressed with a sterile dressing and connected to a pressurized A-line arterial tubing. Waveform is excellent. The patient was then positioned with her neck slightly extended and rotated to the left and then draped in a sterile fashion.   A curvilinear incision was made along the anterior margin of the sternocleidomastoid muscle and carried down through soft tissue. The platysma was transected. The external jugular vein was ligated between 2-0 silk ties and at the level of the omohyoid, the common carotid artery was identified. The digastric was then also identified in the upper margin of the dissection. The pre-arterial lymphatic fat pad was then resected and moved laterally. Of note, the vagus nerve is found to be located anterior and medial to the jugular vein in the carotid artery. The common carotid artery is dissected circumferentially and subsequently the external carotid artery and superior thyroid branch were dissected and looped with Silastic vessel loops.   The internal carotid artery was then dissected. The kink was encountered. This is essentially a 240 degree kink, which then more distally doubles back in a greater than 180 curve. A total of 7000 units of heparin was given and allowed to circulate. The common carotid artery was clamped, followed by the external, followed by the internal. Arteriotomy was made and subsequently a Sundt shunt was placed. The internal carotid artery was quite small and the Sundt shunt was a  little difficult to put in, but it does slide without significant resistance. Flow was re-established to the brain. Endarterectomy was then performed under direct visualization for the common bulb and internal carotid artery. The external carotid artery was treated with  an eversion technique.   The kink was then addressed. The artery was abnormal with a dense adventitia at the  kink. This was lysed, freeing the artery somewhat. It is then pulled in an inferior direction, creating approximately a 2 cm fold in the common carotid artery. I, therefore, elected to resect the common carotid artery and I performed an end-to-end anastomosis using interrupted 6-0 Prolene. A total of 7 sutures were used. Following this, the internal was noted to be quite nice and straight. The intimal appears healthy and a CorMatrix patch was then applied to repair the arterial defect using running 6-0 Prolene in a 4-quadrant technique. Flushing maneuvers were performed. The Sundt shunt was removed and the suture line was completed.   Upon re-establishing flow, there was an area of bleeding in the common carotid suture line, which was easily controlled with a single 6 horizontal mattress suture. There were also 2 areas along the suture line of the patch that were controlled with 6-0 Prolene as well. After this, there was hemostasis. Pressure was held for approximately 5 minutes. The wound was then irrigated, Surgicel and Evicel were placed in the wound and the wound was closed. The platysma was then reapproximated with running 3-0 Vicryl and the skin was closed with 4-0 Monocryl subcuticular. The patient tolerated the procedure without hemodynamic instability and was awakened in the operating room, moving all extremities and obeying simple commands. She was taken to the recovery area in stable condition.     ____________________________ Katha Cabal, MD ggs:TT D: 12/28/2013 17:03:41 ET T: 12/28/2013 17:50:20 ET JOB#: [id number]  cc: Katha Cabal, MD, <Dictator> Katha Cabal MD ELECTRONICALLY SIGNED 01/02/2014 8:00

## 2014-06-17 LAB — SURGICAL PATHOLOGY

## 2014-06-20 ENCOUNTER — Ambulatory Visit
Admit: 2014-06-20 | Disposition: A | Discharge status: Home / Self Care | Payer: Self-pay | Attending: Pain Medicine | Admitting: Pain Medicine

## 2014-07-07 ENCOUNTER — Encounter: Payer: Self-pay | Admitting: Pain Medicine

## 2014-07-07 DIAGNOSIS — M5481 Occipital neuralgia: Secondary | ICD-10-CM

## 2014-07-07 DIAGNOSIS — M1710 Unilateral primary osteoarthritis, unspecified knee: Secondary | ICD-10-CM | POA: Insufficient documentation

## 2014-07-07 DIAGNOSIS — M5136 Other intervertebral disc degeneration, lumbar region: Secondary | ICD-10-CM

## 2014-07-07 DIAGNOSIS — M51369 Other intervertebral disc degeneration, lumbar region without mention of lumbar back pain or lower extremity pain: Secondary | ICD-10-CM | POA: Insufficient documentation

## 2014-07-07 DIAGNOSIS — M170 Bilateral primary osteoarthritis of knee: Secondary | ICD-10-CM

## 2014-07-07 DIAGNOSIS — M5030 Other cervical disc degeneration, unspecified cervical region: Secondary | ICD-10-CM | POA: Insufficient documentation

## 2014-07-07 DIAGNOSIS — M179 Osteoarthritis of knee, unspecified: Secondary | ICD-10-CM | POA: Insufficient documentation

## 2014-07-08 ENCOUNTER — Ambulatory Visit: Payer: Medicare Other | Attending: Pain Medicine | Admitting: Pain Medicine

## 2014-07-08 ENCOUNTER — Encounter: Payer: Self-pay | Admitting: Pain Medicine

## 2014-07-08 VITALS — BP 115/53 | HR 59 | Temp 97.0°F | Resp 20 | Ht <= 58 in | Wt 180.0 lb

## 2014-07-08 DIAGNOSIS — M25562 Pain in left knee: Secondary | ICD-10-CM | POA: Diagnosis present

## 2014-07-08 DIAGNOSIS — M170 Bilateral primary osteoarthritis of knee: Secondary | ICD-10-CM | POA: Insufficient documentation

## 2014-07-08 DIAGNOSIS — M25561 Pain in right knee: Secondary | ICD-10-CM | POA: Diagnosis present

## 2014-07-08 MED ORDER — CEFUROXIME AXETIL 250 MG PO TABS: 250.0000 mg | ORAL_TABLET | Freq: Two times a day (BID) | ORAL | Status: DC

## 2014-07-08 NOTE — Patient Instructions (Signed)
Continue present medications and antibioticcs  F/U PCP for evaliation of  BP and general medical  condition.  F/U surgical evaluation.  F/U nrurological evaluation.  May consider radiofrequency rhizolysis or intraspinal procedures pending response to present treatment and F/U evaluation.  Patient to call [facility] should patient have concerns prior to scheduled return appointment.

## 2014-07-08 NOTE — Progress Notes (Signed)
Discharge to home with husband via [place]. Tolerated procedure well.

## 2014-07-08 NOTE — Progress Notes (Signed)
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 Patient with head of bed elevated 45 with knee flexed Betadine prep of proposed entry site accomplished  Right knee injection medial approach  Following Betadine prep of proposed entry site 22-gauge needle was inserted inferior and medial to the patella. A total of 2 cc of quarter percent bupivacaine with Depo-Medrol was injected. Needle was removed  Right knee injection lateral approach  Repeat the procedure on the right as was performed on the left  A total of 40 mg of Depo-Medrol was utilized for the procedure    Plan  Continue present medications and antibioticcs  F/U PCP for evaliation of  BP and general medical  condition.  F/U surgical evaluation.  F/U nrurological evaluation.  May consider radiofrequency rhizolysis or intraspinal procedures pending response to present treatment and F/U evaluation.  Patient to call [facility] should patient have concerns prior to scheduled return appointment.

## 2014-07-08 NOTE — Progress Notes (Signed)
Subjective:    Patient ID: Emily Kemp, female    DOB: [birth year], 74 y.o.   MRN: [id number]  HPI    Review of Systems     Objective:   Physical Exam        Assessment & Plan:  GENERAL RISKS AND COMPLICATIONS  What are the risk, side effects and possible complications? Generally speaking, most procedures are safe.  However, with any procedure there are risks, side effects, and the possibility of complications.  The risks and complications are dependent upon the sites that are lesioned, or the type of nerve block to be performed.  The closer the procedure is to the spine, the more serious the risks are.  Great care is taken when placing the radio frequency needles, block needles or lesioning probes, but sometimes complications can occur. 1. Infection: Any time there is an injection through the skin, there is a risk of infection.  This is why sterile conditions are used for these blocks.  There are four possible types of infection. 1. Localized skin infection. 2. Central Nervous System Infection-This can be in the form of Meningitis, which can be deadly. 3. Epidural Infections-This can be in the form of an epidural abscess, which can cause pressure inside of the spine, causing compression of the spinal cord with subsequent paralysis. This would require an emergency surgery to decompress, and there are no guarantees that the patient would recover from the paralysis. 4. Discitis-This is an infection of the intervertebral discs.  It occurs in about 1% of discography procedures.  It is difficult to treat and it may lead to surgery.        2. Pain: the needles have to go through skin and soft tissues, will cause soreness.       3. Damage to internal structures:  The nerves to be lesioned may be near blood vessels or    other nerves which can be potentially damaged.       4. Bleeding: Bleeding is more common if the patient is taking blood thinners such as  aspirin, Coumadin, Ticiid,  Plavix, etc., or if he/she have some genetic predisposition  such as hemophilia. Bleeding into the spinal canal can cause compression of the spinal  cord with subsequent paralysis.  This would require an emergency surgery to  decompress and there are no guarantees that the patient would recover from the  paralysis.       5. Pneumothorax:  Puncturing of a lung is a possibility, every time a needle is introduced in  the area of the chest or upper back.  Pneumothorax refers to free air around the  collapsed lung(s), inside of the thoracic cavity (chest cavity).  Another two possible  complications related to a similar event would include: Hemothorax and Chylothorax.   These are variations of the Pneumothorax, where instead of air around the collapsed  lung(s), you may have blood or chyle, respectively.       6. Spinal headaches: They may occur with any procedures in the area of the spine.       7. Persistent CSF (Cerebro-Spinal Fluid) leakage: This is a rare problem, but may occur  with prolonged intrathecal or epidural catheters either due to the formation of a fistulous  track or a dural tear.       8. Nerve damage: By working so close to the spinal cord, there is always a possibility of  nerve damage, which could be as serious as a permanent spinal  cord injury with  paralysis.       9. Death:  Although rare, severe deadly allergic reactions known as "Anaphylactic  reaction" can occur to any of the medications used.      10. Worsening of the symptoms:  We can always make thing worse.  What are the chances of something like this happening? Chances of any of this occuring are extremely low.  By statistics, you have more of a chance of getting killed in a motor vehicle accident: while driving to the hospital than any of the above occurring .  Nevertheless, you should be aware that they are possibilities.  In general, it is similar to taking a shower.  Everybody knows that you can slip, hit your head and get  killed.  Does that mean that you should not shower again?  Nevertheless always keep in mind that statistics do not mean anything if you happen to be on the wrong side of them.  Even if a procedure has a 1 (one) in a 1,000,000 (million) chance of going wrong, it you happen to be that one..Also, keep in mind that by statistics, you have more of a chance of having something go wrong when taking medications.  Who should not have this procedure? If you are on a blood thinning medication (e.g. Coumadin, Plavix, see list of "Blood Thinners"), or if you have an active infection going on, you should not have the procedure.  If you are taking any blood thinners, please inform your physician.  How should I prepare for this procedure?  Do not eat or drink anything at least six hours prior to the procedure.  Bring a driver with you .  It cannot be a taxi.  Come accompanied by an adult that can drive you back, and that is strong enough to help you if your legs get weak or numb from the local anesthetic.  Take all of your medicines the morning of the procedure with just enough water to swallow them.  If you have diabetes, make sure that you are scheduled to have your procedure done first thing in the morning, whenever possible.  If you have diabetes, take only half of your insulin dose and notify our nurse that you have done so as soon as you arrive at the clinic.  If you are diabetic, but only take blood sugar pills (oral hypoglycemic), then do not take them on the morning of your procedure.  You may take them after you have had the procedure.  Do not take aspirin or any aspirin-containing medications, at least eleven (11) days prior to the procedure.  They may prolong bleeding.  Wear loose fitting clothing that may be easy to take off and that you would not mind if it got stained with Betadine or blood.  Do not wear any jewelry or perfume  Remove any nail coloring.  It will interfere with some of our  monitoring equipment.  NOTE: Remember that this is not meant to be interpreted as a complete list of all possible complications.  Unforeseen problems may occur.  BLOOD THINNERS The following drugs contain aspirin or other products, which can cause increased bleeding during surgery and should not be taken for 2 weeks prior to and 1 week after surgery.  If you should need take something for relief of minor pain, you may take acetaminophen which is found in Tylenol,m Datril, Anacin-3 and Panadol. It is not blood thinner. The products listed below are.  Do not take any  of the products listed below in addition to any listed on your instruction sheet.  A.P.C or A.P.C with Codeine Codeine Phosphate Capsules #3 Ibuprofen Ridaura  ABC compound Congesprin Imuran rimadil  Advil Cope Indocin Robaxisal  Alka-Seltzer Effervescent Pain Reliever and Antacid Coricidin or Coricidin-D  Indomethacin Rufen  Alka-Seltzer plus Cold Medicine Cosprin Ketoprofen S-A-C Tablets  Anacin Analgesic Tablets or Capsules Coumadin Korlgesic Salflex  Anacin Extra Strength Analgesic tablets or capsules CP-2 Tablets Lanoril Salicylate  Anaprox Cuprimine Capsules Levenox Salocol  Anexsia-D Dalteparin Magan Salsalate  Anodynos Darvon compound Magnesium Salicylate Sine-off  Ansaid Dasin Capsules Magsal Sodium Salicylate  Anturane Depen Capsules Marnal Soma  APF Arthritis pain formula Dewitt's Pills Measurin Stanback  Argesic Dia-Gesic Meclofenamic Sulfinpyrazone  Arthritis Bayer Timed Release Aspirin Diclofenac Meclomen Sulindac  Arthritis pain formula Anacin Dicumarol Medipren Supac  Analgesic (Safety coated) Arthralgen Diffunasal Mefanamic Suprofen  Arthritis Strength Bufferin Dihydrocodeine Mepro Compound Suprol  Arthropan liquid Dopirydamole Methcarbomol with Aspirin Synalgos  ASA tablets/Enseals Disalcid Micrainin Tagament  Ascriptin Doan's Midol Talwin  Ascriptin A/D Dolene Mobidin Tanderil  Ascriptin Extra Strength  Dolobid Moblgesic Ticlid  Ascriptin with Codeine Doloprin or Doloprin with Codeine Momentum Tolectin  Asperbuf Duoprin Mono-gesic Trendar  Aspergum Duradyne Motrin or Motrin IB Triminicin  Aspirin plain, buffered or enteric coated Durasal Myochrisine Trigesic  Aspirin Suppositories Easprin Nalfon Trillsate  Aspirin with Codeine Ecotrin Regular or Extra Strength Naprosyn Uracel  Atromid-S Efficin Naproxen Ursinus  Auranofin Capsules Elmiron Neocylate Vanquish  Axotal Emagrin Norgesic Verin  Azathioprine Empirin or Empirin with Codeine Normiflo Vitamin E  Azolid Emprazil Nuprin Voltaren  Bayer Aspirin plain, buffered or children's or timed BC Tablets or powders Encaprin Orgaran Warfarin Sodium  Buff-a-Comp Enoxaparin Orudis Zorpin  Buff-a-Comp with Codeine Equegesic Os-Cal-Gesic   Buffaprin Excedrin plain, buffered or Extra Strength Oxalid   Bufferin Arthritis Strength Feldene Oxphenbutazone   Bufferin plain or Extra Strength Feldene Capsules Oxycodone with Aspirin   Bufferin with Codeine Fenoprofen Fenoprofen Pabalate or Pabalate-SF   Buffets II Flogesic Panagesic   Buffinol plain or Extra Strength Florinal or Florinal with Codeine Panwarfarin   Buf-Tabs Flurbiprofen Penicillamine   Butalbital Compound Four-way cold tablets Penicillin   Butazolidin Fragmin Pepto-Bismol   Carbenicillin Geminisyn Percodan   Carna Arthritis Reliever Geopen Persantine   Carprofen Gold's salt Persistin   Chloramphenicol Goody's Phenylbutazone   Chloromycetin Haltrain Piroxlcam   Clmetidine heparin Plaquenil   Cllnoril Hyco-pap Ponstel   Clofibrate Hydroxy chloroquine Propoxyphen         Before stopping any of these medications, be sure to consult the physician who ordered them.  Some, such as Coumadin (Warfarin) are ordered to prevent or treat serious conditions such as "deep thrombosis", "pumonary embolisms", and other heart problems.  The amount of time that you may need off of the medication may also  vary with the medication and the reason for which you were taking it.  If you are taking any of these medications, please make sure you notify your pain physician before you undergo any procedures.

## 2014-07-09 ENCOUNTER — Telehealth: Payer: Self-pay | Admitting: *Deleted

## 2014-07-09 NOTE — Telephone Encounter (Signed)
Patient denies any problems post procedure.  

## 2014-07-12 ENCOUNTER — Other Ambulatory Visit: Payer: Self-pay

## 2014-07-12 DIAGNOSIS — R921 Mammographic calcification found on diagnostic imaging of breast: Secondary | ICD-10-CM

## 2014-07-16 ENCOUNTER — Ambulatory Visit: Payer: Medicare Other | Attending: Pain Medicine | Admitting: Pain Medicine

## 2014-07-16 ENCOUNTER — Encounter: Payer: Self-pay | Admitting: Pain Medicine

## 2014-07-16 VITALS — BP 96/59 | HR 59 | Temp 97.7°F | Resp 16 | Ht <= 58 in | Wt 179.0 lb

## 2014-07-16 DIAGNOSIS — M545 Low back pain: Secondary | ICD-10-CM | POA: Diagnosis present

## 2014-07-16 DIAGNOSIS — M533 Sacrococcygeal disorders, not elsewhere classified: Secondary | ICD-10-CM | POA: Insufficient documentation

## 2014-07-16 DIAGNOSIS — M5136 Other intervertebral disc degeneration, lumbar region: Secondary | ICD-10-CM | POA: Diagnosis not present

## 2014-07-16 DIAGNOSIS — M5126 Other intervertebral disc displacement, lumbar region: Secondary | ICD-10-CM | POA: Insufficient documentation

## 2014-07-16 DIAGNOSIS — M7700 Medial epicondylitis, unspecified elbow: Secondary | ICD-10-CM | POA: Diagnosis not present

## 2014-07-16 DIAGNOSIS — M25561 Pain in right knee: Secondary | ICD-10-CM | POA: Diagnosis present

## 2014-07-16 DIAGNOSIS — M5030 Other cervical disc degeneration, unspecified cervical region: Secondary | ICD-10-CM

## 2014-07-16 DIAGNOSIS — M7060 Trochanteric bursitis, unspecified hip: Secondary | ICD-10-CM | POA: Diagnosis not present

## 2014-07-16 DIAGNOSIS — M170 Bilateral primary osteoarthritis of knee: Secondary | ICD-10-CM

## 2014-07-16 DIAGNOSIS — M5481 Occipital neuralgia: Secondary | ICD-10-CM | POA: Diagnosis not present

## 2014-07-16 MED ORDER — HYDROCODONE-ACETAMINOPHEN 7.5-325 MG PO TABS: ORAL_TABLET | ORAL | Status: DC

## 2014-07-16 MED ORDER — METHOCARBAMOL 500 MG PO TABS: ORAL_TABLET | ORAL | Status: DC

## 2014-07-16 MED ORDER — DICLOFENAC SODIUM 1 % TD GEL: 4.0000 g | Freq: Four times a day (QID) | TRANSDERMAL | Status: DC

## 2014-07-16 MED ORDER — METHADONE HCL 5 MG PO TABS: 2.5000 mg | ORAL_TABLET | Freq: Two times a day (BID) | ORAL | Status: DC

## 2014-07-16 NOTE — Patient Instructions (Addendum)
Continue present medications.  F/U PCP for evaliation of  BP and general medical  condition.. See primary care physician regarding blood pressure which is low on today's visit  F/U surgical evaluation.  F/U neurological evaluation.  May consider radiofrequency rhizolysis or intraspinal procedures pending response to present treatment and F/U evaluation.  Patient to call [facility] should patient have concerns prior to scheduled return appointment.

## 2014-07-16 NOTE — Progress Notes (Signed)
   Subjective:    Patient ID: Emily Kemp, female    DOB: March 11, 1940, 74 y.o.   MRN: [id number]  HPI  Patient is 74 year old female returns to [place] for further evaluation and treatment of pain involving the lower back lower extremity region with pain of the right knee haven't improved with prior treatment. She and the use of Voltaren gel applied to the knee and we will avoid interventional treatment at this time. Patient is afebrile that she has been able to walk more and is walking up and down ramp at her home. Patient is continuing to attempt to increase her exercising on a daily basis. We'll continue presently prescribed medications and all understanding and agreed statistic and plan       Review of Systems     Objective:   Physical Exam  Tends to palpation of the splenius capitis occipitalis muscles of mild degree with mild tenderness over the region of the acromioclavicular and glenohumeral joint regions. There was mild tenderness of the epicondyles medial and lateral aspects of the left elbow. With grip strength slightly decreased. Palpation of the cervical and thoracic facet region reproduces mild discomfort with no crepitus of the thoracic region noted. Tinel and Phalen's maneuver without increased pain of significant degree. Her to be with just palpation of the lumbar paraspinal muscles lumbar facet region of mild to moderate degree. And is on the greater enteric region of moderate degree. Outpatient of the right knee was a tends to palpation of mild to moderate degree with no increased warmth or erythema of the knee noted and with negative anterior and posterior drawer signs. The patella was noted. Straight leg raising tolerates approximately 20 without increased pain with dorsiflexion noted and without sensory deficit or dermatomal dispute detected. Abdomen was nontender and no costovertebral angle tenderness was noted.      Assessment & Plan:     Degenerative disc disease lumbar spine Broad-based disc bulging L3-4, L4-L5, and L5-S1 with central disc bulge and impression upon the central thecal sac L4-L5 with broad-based disc bulge and annular tear at L5-S1  Lumbar facet syndrome  Degenerative disc disease cervical spine  Occipital neuralgia  Degenerative joint disease of the  Greater trochanteric bursitis  Epicondylitis of elbow  Sacroiliac joint dysfunction

## 2014-07-16 NOTE — Progress Notes (Signed)
Left wheelchair  With scripts of methadone and hydrocodone at 935am

## 2014-07-16 NOTE — Progress Notes (Signed)
   Subjective:    Patient ID: Emily Kemp, female    DOB: 05-Dec-1940, 73 y.o.   MRN: [id number]  HPI    Review of Systems     Objective:   Physical Exam        Assessment & Plan:

## 2014-07-26 ENCOUNTER — Ambulatory Visit: Payer: No Typology Code available for payment source

## 2014-07-26 ENCOUNTER — Other Ambulatory Visit: Payer: Self-pay | Admitting: General Surgery

## 2014-07-26 ENCOUNTER — Ambulatory Visit: Admission: RE | Admit: 2014-07-26 | Payer: No Typology Code available for payment source | Source: Ambulatory Visit

## 2014-07-26 ENCOUNTER — Ambulatory Visit
Admission: RE | Admit: 2014-07-26 | Discharge: 2014-07-26 | Disposition: A | Discharge status: Home / Self Care | Payer: Medicare Other | Source: Ambulatory Visit | Attending: General Surgery | Admitting: General Surgery

## 2014-07-26 DIAGNOSIS — R921 Mammographic calcification found on diagnostic imaging of breast: Secondary | ICD-10-CM | POA: Insufficient documentation

## 2014-08-06 ENCOUNTER — Ambulatory Visit (INDEPENDENT_AMBULATORY_CARE_PROVIDER_SITE_OTHER): Payer: Medicare Other | Admitting: General Surgery

## 2014-08-06 ENCOUNTER — Encounter: Payer: Self-pay | Admitting: General Surgery

## 2014-08-06 DIAGNOSIS — R920 Mammographic microcalcification found on diagnostic imaging of breast: Secondary | ICD-10-CM | POA: Diagnosis not present

## 2014-08-06 NOTE — Patient Instructions (Signed)
Patient will be asked to return to the office in one year with a bilateral diagnostic mammogram Continue self breast exams. Call office for any new breast issues or concerns.

## 2014-08-06 NOTE — Progress Notes (Signed)
Patient ID: Emily Kemp, female   DOB: [birth year], 74 y.o.   MRN: [id number]  Chief Complaint  Patient presents with  . Follow-up    HPI Emily Kemp is a 74 y.o. female.  who presents for her follow up mammogram and breast evaluation. The most recent mammogram was done on 07-26-14.  Patient does perform regular self breast checks and gets regular mammograms done.  She has noticed a rash under the right breast that she has been using desitin cream as needed and it is helping.  HPI  Past Medical History  Diagnosis Date  . Benign neoplasm of skin of trunk, except scrotum   . Hypertension   . Barrett esophagus   . Asthma   . Osteoporosis   . Cystitis   . Arthritis   . Morbid obesity   . Breast screening, unspecified   . GERD (gastroesophageal reflux disease)   . Screening for obesity   . Psoriasis   . Torn meniscus 2014    right knee  . Lumbar herniated disc   . Scoliosis   . Degenerative disc disease, lumbar   . Sleep apnea     Past Surgical History  Procedure Laterality Date  . Colonoscopy  2009, 2013    Dr. Dionne Milo  . Abdominal hysterectomy  1996  . Salpingoophorectomy  1996  . Upper gi endoscopy  2012  . Carotid artery surgery  12/2013  . Foot surgery Left   . Tonsillectomy N/A   . Total abdominal hysterectomy N/A   . Breast biopsy  1998, 2007  . Breast biopsy Bilateral     Family History  Problem Relation Age of Onset  . Breast cancer Maternal Aunt 20  . Breast cancer Paternal Aunt 63    Social History History  Substance Use Topics  . Smoking status: Former Smoker    Quit date: 02/22/1990  . Smokeless tobacco: Not on file  . Alcohol Use: Yes    Allergies  Allergen Reactions  . Benztropine Other (See Comments)  . Catapres  [Clonidine Hcl]     Other reaction(s): Pruritic rash from adhesive  . Lidocaine     Other reaction(s): Pruritic rash from adhesive  . Lithium     Other reaction(s): Altered mental status (finding)  . Wellbutrin  [Bupropion] Hives and Other (See Comments)    rash  . Bacitracin-Polymyxin B Rash  . Latex Rash and Swelling    Other reaction(s): Weal  . Paxil [Paroxetine Hcl] Rash and Other (See Comments)    rash  . Sulfa Antibiotics Rash and Other (See Comments)    rash  . Tape Rash    Current Outpatient Prescriptions  Medication Sig Dispense Refill  . amLODipine-valsartan (EXFORGE) 10-320 MG per tablet Take 1 tablet by mouth daily.    Marland Kitchen aspirin 325 MG tablet Take 325 mg by mouth daily.    Marland Kitchen atorvastatin (LIPITOR) 20 MG tablet Take 20 mg by mouth daily.    . celecoxib (CELEBREX) 100 MG capsule Take 100 mg by mouth as needed for pain.    . cloNIDine (CATAPRES) 0.1 MG tablet Take 0.2 mg by mouth 2 (two) times daily.     Marland Kitchen conjugated estrogens (PREMARIN) vaginal cream Place 1 Applicatorful vaginally 2 (two) times daily.    . diclofenac sodium (VOLTAREN) 1 % GEL Apply 4 g topically 4 (four) times daily. 500 g 2  . DULoxetine (CYMBALTA) 60 MG capsule Take 60 mg by mouth 2 (two) times daily.     Marland Kitchen  esomeprazole (NEXIUM) 40 MG capsule Take 40 mg by mouth 2 (two) times daily.    . fexofenadine (ALLEGRA) 180 MG tablet Take 180 mg by mouth daily.    Marland Kitchen HYDROcodone-acetaminophen (NORCO) 7.5-325 MG per tablet Limit 1 tablet by mouth per day or twice a day if tolerated 60 tablet 0  . L-Methylfolate-B12-B6-B2 (CEREFOLIN PO) Take 1 tablet by mouth daily.    Marland Kitchen lamoTRIgine (LAMICTAL) 200 MG tablet Take 200 mg by mouth 2 (two) times daily.    Marland Kitchen LORazepam (ATIVAN) 1 MG tablet Take 1 mg by mouth 3 (three) times daily as needed.     . metFORMIN (GLUCOPHAGE) 500 MG tablet Take 500 mg by mouth 3 (three) times daily. 2 at supper    . methadone (DOLOPHINE) 5 MG tablet Take 0.5 tablets (2.5 mg total) by mouth every 12 (twelve) hours. 30 tablet 0  . methocarbamol (ROBAXIN) 500 MG tablet Limit 1-3 tablets by mouth per day if tolerated 90 tablet 0  . metoprolol (LOPRESSOR) 50 MG tablet Take 50 mg by mouth 2 (two) times daily.     . pantoprazole (PROTONIX) 40 MG tablet Take 40 mg by mouth daily.    . pramipexole (MIRAPEX) 0.25 MG tablet Take 0.25 mg by mouth as needed.    . pregabalin (LYRICA) 25 MG capsule Take 150 mg by mouth 2 (two) times daily.     . traZODone (DESYREL) 100 MG tablet Take 300 mg by mouth at bedtime.     . triamcinolone cream (KENALOG) 0.5 % Apply 1 application topically as needed.    . Zinc Oxide (DESITIN CREAMY EX) Apply topically as needed.     No current facility-administered medications for this visit.    Review of Systems Review of Systems  Constitutional: Negative.   Respiratory: Negative.   Cardiovascular: Negative.   All other systems reviewed and are negative.   Blood pressure 112/58, pulse 64, resp. rate 14, height 4\' 10"  (1.473 m), weight 182 lb (82.555 kg).  Physical Exam Physical Exam  Constitutional: She is oriented to person, place, and time. She appears well-developed and well-nourished.  HENT:  Mouth/Throat: Oropharynx is clear and moist.  Eyes: Conjunctivae and EOM are normal.  Neck: Neck supple.  Cardiovascular: Normal rate, regular rhythm and normal heart sounds.   Pulmonary/Chest: Effort normal and breath sounds normal. Right breast exhibits no inverted nipple, no mass, no nipple discharge, no skin change and no tenderness. Left breast exhibits no inverted nipple, no mass, no nipple discharge, no skin change and no tenderness.  Lymphadenopathy:    She has no cervical adenopathy.    She has no axillary adenopathy.  Neurological: She is alert and oriented to person, place, and time. No cranial nerve deficit.  Skin: Skin is warm and dry.    Data Reviewed Bilateral diagnostic mammograms with tomo synthesis dated 07/26/2014 were reviewed. Calcifications are unchanged. BI-RADS-2.These films were reviewed. Tomo synthesis images are not available.  Assessment    Benign breast exam.    Plan        Patient will be asked to return to the office in one year with a  bilateral diagnostic mammogram.   PCP:  Austin Miles 08/06/2014, 9:29 AM

## 2014-08-07 DIAGNOSIS — R920 Mammographic microcalcification found on diagnostic imaging of breast: Secondary | ICD-10-CM | POA: Insufficient documentation

## 2014-08-15 ENCOUNTER — Encounter: Payer: No Typology Code available for payment source | Admitting: Pain Medicine

## 2014-08-22 ENCOUNTER — Encounter: Payer: No Typology Code available for payment source | Admitting: Pain Medicine

## 2014-08-28 ENCOUNTER — Ambulatory Visit: Payer: Medicare Other | Attending: Pain Medicine | Admitting: Pain Medicine

## 2014-08-28 ENCOUNTER — Encounter: Payer: Self-pay | Admitting: Pain Medicine

## 2014-08-28 VITALS — BP 107/47 | HR 74 | Temp 97.9°F | Resp 16 | Ht <= 58 in | Wt 181.0 lb

## 2014-08-28 DIAGNOSIS — M7711 Lateral epicondylitis, right elbow: Secondary | ICD-10-CM | POA: Insufficient documentation

## 2014-08-28 DIAGNOSIS — M5136 Other intervertebral disc degeneration, lumbar region: Secondary | ICD-10-CM | POA: Insufficient documentation

## 2014-08-28 DIAGNOSIS — M5030 Other cervical disc degeneration, unspecified cervical region: Secondary | ICD-10-CM | POA: Diagnosis not present

## 2014-08-28 DIAGNOSIS — M79605 Pain in left leg: Secondary | ICD-10-CM | POA: Diagnosis present

## 2014-08-28 DIAGNOSIS — M179 Osteoarthritis of knee, unspecified: Secondary | ICD-10-CM | POA: Insufficient documentation

## 2014-08-28 DIAGNOSIS — M546 Pain in thoracic spine: Secondary | ICD-10-CM | POA: Diagnosis present

## 2014-08-28 DIAGNOSIS — G5620 Lesion of ulnar nerve, unspecified upper limb: Secondary | ICD-10-CM | POA: Insufficient documentation

## 2014-08-28 DIAGNOSIS — M170 Bilateral primary osteoarthritis of knee: Secondary | ICD-10-CM

## 2014-08-28 DIAGNOSIS — M5126 Other intervertebral disc displacement, lumbar region: Secondary | ICD-10-CM | POA: Insufficient documentation

## 2014-08-28 DIAGNOSIS — M533 Sacrococcygeal disorders, not elsewhere classified: Secondary | ICD-10-CM | POA: Insufficient documentation

## 2014-08-28 DIAGNOSIS — M5481 Occipital neuralgia: Secondary | ICD-10-CM

## 2014-08-28 DIAGNOSIS — M542 Cervicalgia: Secondary | ICD-10-CM | POA: Diagnosis present

## 2014-08-28 DIAGNOSIS — M7712 Lateral epicondylitis, left elbow: Secondary | ICD-10-CM | POA: Diagnosis not present

## 2014-08-28 MED ORDER — METHADONE HCL 5 MG PO TABS: ORAL_TABLET | ORAL | Status: DC

## 2014-08-28 MED ORDER — METHOCARBAMOL 500 MG PO TABS: ORAL_TABLET | ORAL | Status: DC

## 2014-08-28 MED ORDER — HYDROCODONE-ACETAMINOPHEN 7.5-325 MG PO TABS: ORAL_TABLET | ORAL | Status: DC

## 2014-08-28 NOTE — Progress Notes (Signed)
Patient discharge home in wheelchair  Accompanied by husband at  1350hrs. Scripts given for methadone,  And norco Teach back done

## 2014-08-28 NOTE — Progress Notes (Signed)
   Subjective:    Patient ID: Emily Kemp, female    DOB: 1940-10-28, 74 y.o.   MRN: [id number]  HPI  Patient is 74 year old female who returns to [place] for further evaluation and treatment of pain involving the neck and entire back upper and lower extremity regions. Patient states that she recently had minor accident involving the left lower extremity. Patient stated that she had some transient swelling of the left foot. We discussed patient's condition with patient husband present. During today's evaluation. The patient expressed sadness over her husband setting a new job which will require him to be away during the day and return home during the evenings. We suggested the patient follow-up with Dr. Bridgett Larsson reassess her condition since patient displayed significant elements of depression. We will remain available to consider modifications of treatment as discussed with patient on today's visit. The patient was understanding and agree with suggested treatment plan   Review of Systems     Objective:   Physical Exam there was tenderness over the splenius capitis of talus region of moderate degree. Palpation over the region of the cervical facet cervical paraspinal must region reproduces moderate discomfort. There was tenderness over the region of the thoracic facet thoracic paraspinal must region with moderate tends to palpation over the thoracic region noted. Ear to be with slightly decreased grip strength.. There was tennis of the epicondyles of the elbow both medial and lateral epicondyles of the left and right upper extremities. Tinel and Phalen's maneuver were without increase of pain of significant degree. There was tends to palpation over the region of the lumbar facet lumbar paraspinal muscular region. Palpation over the region of the knee was a tends to palpation with negative anterior and posterior drawer signs. There was no ballottement of the patella. EHL strength appeared to  be decreased. The left lower extremity was without increased swelling and was without increased tenderness to palpation. EHL strength appeared to be within normal limits. There was negative clonus negative Homans. No sensory deficit of dermatomal distribution of the lower extreme is noted. Tenderness of the greater trochanteric region iliotibial band region. Negative clonus negative Homans.      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  F/U surgical evaluation  F/U neurological evaluation  F/U evaluation with Dr. Bridgett Larsson for reassessment of patient's condition as discussed  May consider radiofrequency rhizolysis or intraspinal procedures pending response to present treatment and F/U evaluation.  Patient to call [facility] should patient have concerns prior to scheduled return appointment.

## 2014-08-28 NOTE — Progress Notes (Signed)
Safety precautions to be maintained throughout the outpatient stay will include: orient to surroundings, keep bed in low position, maintain call bell within reach at all times, provide assistance with transfer out of bed and ambulation.  

## 2014-08-28 NOTE — Patient Instructions (Addendum)
Continue present medications hydrocodone acetaminophen  Methadone and Robaxin  F/U PCP Dr.Tejan-sie  for evaliation of  BP and general medical  condition.  F/U surgical evaluation  F/U neurological evaluation  F/U Dr. Bridgett Larsson as discussed for reevaluation at this time  May consider radiofrequency rhizolysis or intraspinal procedures pending response to present treatment and F/U evaluation.  Patient to call [facility] should patient have concerns prior to scheduled return appointment.

## 2014-09-26 ENCOUNTER — Ambulatory Visit: Payer: Medicare Other | Attending: Pain Medicine | Admitting: Pain Medicine

## 2014-09-26 ENCOUNTER — Encounter: Payer: Self-pay | Admitting: Pain Medicine

## 2014-09-26 VITALS — BP 102/55 | HR 59 | Temp 98.6°F | Resp 16 | Ht <= 58 in | Wt 176.0 lb

## 2014-09-26 DIAGNOSIS — M5481 Occipital neuralgia: Secondary | ICD-10-CM

## 2014-09-26 DIAGNOSIS — M5030 Other cervical disc degeneration, unspecified cervical region: Secondary | ICD-10-CM

## 2014-09-26 DIAGNOSIS — M7711 Lateral epicondylitis, right elbow: Secondary | ICD-10-CM | POA: Diagnosis not present

## 2014-09-26 DIAGNOSIS — M179 Osteoarthritis of knee, unspecified: Secondary | ICD-10-CM | POA: Insufficient documentation

## 2014-09-26 DIAGNOSIS — M5126 Other intervertebral disc displacement, lumbar region: Secondary | ICD-10-CM | POA: Diagnosis not present

## 2014-09-26 DIAGNOSIS — M545 Low back pain: Secondary | ICD-10-CM | POA: Diagnosis present

## 2014-09-26 DIAGNOSIS — M533 Sacrococcygeal disorders, not elsewhere classified: Secondary | ICD-10-CM | POA: Diagnosis not present

## 2014-09-26 DIAGNOSIS — M7712 Lateral epicondylitis, left elbow: Secondary | ICD-10-CM | POA: Diagnosis not present

## 2014-09-26 DIAGNOSIS — M5136 Other intervertebral disc degeneration, lumbar region: Secondary | ICD-10-CM | POA: Diagnosis not present

## 2014-09-26 DIAGNOSIS — M170 Bilateral primary osteoarthritis of knee: Secondary | ICD-10-CM

## 2014-09-26 DIAGNOSIS — M546 Pain in thoracic spine: Secondary | ICD-10-CM | POA: Diagnosis present

## 2014-09-26 DIAGNOSIS — M48062 Spinal stenosis, lumbar region with neurogenic claudication: Secondary | ICD-10-CM | POA: Insufficient documentation

## 2014-09-26 MED ORDER — METHADONE HCL 5 MG PO TABS: ORAL_TABLET | ORAL | Status: DC

## 2014-09-26 MED ORDER — HYDROCODONE-ACETAMINOPHEN 7.5-325 MG PO TABS: ORAL_TABLET | ORAL | Status: DC

## 2014-09-26 NOTE — Progress Notes (Signed)
   Subjective:    Patient ID: Emily Kemp, female    DOB: [birth year], 74 y.o.   MRN: [id number]  HPI  Patient is 74 year old female returns to [place] for further evaluation and treatment of pain involving the upper mid and lower back and lower extremity regions. Patient has increased weakness of the lower extremities with standing and walking. Patient also has pain of the knees felt to be due to degenerative joint disease of the knee. At the present time we will have patient undergo neurosurgical reevaluation of her lower back lower extremity pain and weakness and will consider patient for interventional treatment pending surgical disposition. The patient was understanding and in agreement with suggested treatment plan.    Review of Systems     Objective:   Physical Exam  There was mild tenderness of the splenius capitis and occipitalis musculature region as well as the cervical facet cervical paraspinal musculature region and the thoracic facet and thoracic paraspinal musculature region. There appeared to be unremarkable Spurling's maneuver. Patient was with slightly decreased grip strength on the left compared to the right. Tinel's and Phalen's maneuver were without increased pain of any significant degree. Palpation over the lumbar paraspinal musculature region lumbar facet region was with moderate discomfort with lateral bending and rotation reproducing moderate pain. There was mild tenderness of the PSIS PSIS region as well as the gluteal and piriformis musculature region. Straight leg raising was limited to approximately 20 without a definite increased pain with dorsiflexion noted. There was tenderness to palpation of the right knee with negative anterior and posterior drawer signs and without ballottement of the patella. There was decreased EHL strength without sensory deficit of dermatomal distribution of the lower extremities. There was negative clonus negative Homans.  Moderate tenderness of the greater trochanteric region iliotibial band region. 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  F/U surgical evaluation. We schedule patient for neurosurgical evaluation of lumbar lower extremity pain. Patient will go for evaluation at [facility] neurosurgery department(  F/U neurological evaluation  F/U evaluation with Dr. Bridgett Larsson for reassessment of patient's condition as discussed  May consider radiofrequency rhizolysis or intraspinal procedures pending response to present treatment and F/U evaluation.  Patient to call [facility] should patient have concerns prior to scheduled return appointment.

## 2014-09-26 NOTE — Patient Instructions (Addendum)
Continue present medications oxycodone methadone and Robaxin  F/U PCP Dr.Tejan-Sie   for evaliation of  BP and general medical  condition  F/U surgical evaluation. Ask Caryl Pina the date of your neurosurgical evaluation at [facility]  F/U neurological evaluation   Try to exercise with caution to avoid aggravation of your symptoms  May consider radiofrequency rhizolysis or intraspinal procedures pending response to present treatment and F/U evaluation   Patient to call [facility] should patient have concerns prior to scheduled return appointmen.  You were given scripts for Hydrocodone and Methadone today,

## 2014-09-26 NOTE — Progress Notes (Signed)
Safety precautions to be maintained throughout the outpatient stay will include: orient to surroundings, keep bed in low position, maintain call bell within reach at all times, provide assistance with transfer out of bed and ambulation.  

## 2014-10-01 DIAGNOSIS — M25511 Pain in right shoulder: Secondary | ICD-10-CM | POA: Insufficient documentation

## 2014-10-01 DIAGNOSIS — G8929 Other chronic pain: Secondary | ICD-10-CM | POA: Insufficient documentation

## 2014-10-01 DIAGNOSIS — K2270 Barrett's esophagus without dysplasia: Secondary | ICD-10-CM | POA: Insufficient documentation

## 2014-10-07 ENCOUNTER — Other Ambulatory Visit: Payer: Self-pay | Admitting: Pain Medicine

## 2014-10-16 ENCOUNTER — Ambulatory Visit: Payer: Medicare Other | Attending: Neurology

## 2014-10-29 ENCOUNTER — Encounter: Payer: Self-pay | Admitting: Pain Medicine

## 2014-10-29 ENCOUNTER — Ambulatory Visit: Payer: Medicare Other | Attending: Pain Medicine | Admitting: Pain Medicine

## 2014-10-29 VITALS — BP 107/59 | HR 61 | Temp 97.1°F | Resp 18 | Ht <= 58 in | Wt 184.0 lb

## 2014-10-29 DIAGNOSIS — M4806 Spinal stenosis, lumbar region: Secondary | ICD-10-CM | POA: Diagnosis not present

## 2014-10-29 DIAGNOSIS — M533 Sacrococcygeal disorders, not elsewhere classified: Secondary | ICD-10-CM | POA: Insufficient documentation

## 2014-10-29 DIAGNOSIS — M7712 Lateral epicondylitis, left elbow: Secondary | ICD-10-CM | POA: Insufficient documentation

## 2014-10-29 DIAGNOSIS — M5030 Other cervical disc degeneration, unspecified cervical region: Secondary | ICD-10-CM | POA: Diagnosis not present

## 2014-10-29 DIAGNOSIS — M7711 Lateral epicondylitis, right elbow: Secondary | ICD-10-CM | POA: Diagnosis not present

## 2014-10-29 DIAGNOSIS — M179 Osteoarthritis of knee, unspecified: Secondary | ICD-10-CM | POA: Insufficient documentation

## 2014-10-29 DIAGNOSIS — M5481 Occipital neuralgia: Secondary | ICD-10-CM

## 2014-10-29 DIAGNOSIS — M542 Cervicalgia: Secondary | ICD-10-CM | POA: Diagnosis present

## 2014-10-29 DIAGNOSIS — M546 Pain in thoracic spine: Secondary | ICD-10-CM | POA: Diagnosis present

## 2014-10-29 DIAGNOSIS — M5136 Other intervertebral disc degeneration, lumbar region: Secondary | ICD-10-CM | POA: Diagnosis not present

## 2014-10-29 DIAGNOSIS — M170 Bilateral primary osteoarthritis of knee: Secondary | ICD-10-CM

## 2014-10-29 DIAGNOSIS — M5126 Other intervertebral disc displacement, lumbar region: Secondary | ICD-10-CM | POA: Insufficient documentation

## 2014-10-29 MED ORDER — METHADONE HCL 5 MG PO TABS: ORAL_TABLET | ORAL | Status: DC

## 2014-10-29 MED ORDER — METHOCARBAMOL 500 MG PO TABS: ORAL_TABLET | ORAL | Status: DC

## 2014-10-29 MED ORDER — HYDROCODONE-ACETAMINOPHEN 7.5-325 MG PO TABS: ORAL_TABLET | ORAL | Status: DC

## 2014-10-29 MED ORDER — DICLOFENAC SODIUM 1 % TD GEL: TRANSDERMAL | Status: AC

## 2014-10-29 NOTE — Progress Notes (Signed)
Safety precautions to be maintained throughout the outpatient stay will include: orient to surroundings, keep bed in low position, maintain call bell within reach at all times, provide assistance with transfer out of bed and ambulation.  

## 2014-10-29 NOTE — Patient Instructions (Addendum)
PLAN   Continue present medication Robaxin hydrocodone acetaminophen methadone and Voltaren gel  Lumbar epidural steroid injection to be performed at time of return appointment  F/U PCP Dr.Tejan-Sie  for evaliation of  BP and general medical  condition  F/U surgical evaluation. May consider pending follow-up evaluation  F/U Dr.Chen as discussed   F/U neurological evaluation. May consider pending follow-up evaluations  May consider radiofrequency rhizolysis or intraspinal procedures pending response to present treatment and F/U evaluation   Patient to call [facility] should patient have concerns prior to scheduled return appointment. Stop Aspirin 5 days before procedure if ok with Dr. Westley Gambles Epidural Steroid Injection Patient Information  Description: The epidural space surrounds the nerves as they exit the spinal cord.  In some patients, the nerves can be compressed and inflamed by a bulging disc or a tight spinal canal (spinal stenosis).  By injecting steroids into the epidural space, we can bring irritated nerves into direct contact with a potentially helpful medication.  These steroids act directly on the irritated nerves and can reduce swelling and inflammation which often leads to decreased pain.  Epidural steroids may be injected anywhere along the spine and from the neck to the low back depending upon the location of your pain.   After numbing the skin with local anesthetic (like Novocaine), a small needle is passed into the epidural space slowly.  You may experience a sensation of pressure while this is being done.  The entire block usually last less than 10 minutes.  Conditions which may be treated by epidural steroids:   Low back and leg pain  Neck and arm pain  Spinal stenosis  Post-laminectomy syndrome  Herpes zoster (shingles) pain  Pain from compression fractures  Preparation for the injection:  1. Do not eat any solid food or dairy products  within 6 hours of your appointment.  2. You may drink clear liquids up to 2 hours before appointment.  Clear liquids include water, black coffee, juice or soda.  No milk or cream please. 3. You may take your regular medication, including pain medications, with a sip of water before your appointment  Diabetics should hold regular insulin (if taken separately) and take 1/2 normal NPH dos the morning of the procedure.  Carry some sugar containing items with you to your appointment. 4. A driver must accompany you and be prepared to drive you home after your procedure.  5. Bring all your current medications with your. 6. An IV may be inserted and sedation may be given at the discretion of the physician.   7. A blood pressure cuff, EKG and other monitors will often be applied during the procedure.  Some patients may need to have extra oxygen administered for a short period. 8. You will be asked to provide medical information, including your allergies, prior to the procedure.  We must know immediately if you are taking blood thinners (like Coumadin/Warfarin)  Or if you are allergic to IV iodine contrast (dye). We must know if you could possible be pregnant.  Possible side-effects:  Bleeding from needle site  Infection (rare, may require surgery)  Nerve injury (rare)  Numbness & tingling (temporary)  Difficulty urinating (rare, temporary)  Spinal headache ( a headache worse with upright posture)  Light -headedness (temporary)  Pain at injection site (several days)  Decreased blood pressure (temporary)  Weakness in arm/leg (temporary)  Pressure sensation in back/neck (temporary)  Call if you experience:  Fever/chills associated with headache or increased back/neck  pain.  Headache worsened by an upright position.  New onset weakness or numbness of an extremity below the injection site  Hives or difficulty breathing (go to the emergency room)  Inflammation or drainage at the infection  site  Severe back/neck pain  Any new symptoms which are concerning to you  Please note:  Although the local anesthetic injected can often make your back or neck feel good for several hours after the injection, the pain will likely return.  It takes 3-7 days for steroids to work in the epidural space.  You may not notice any pain relief for at least that one week.  If effective, we will often do a series of three injections spaced 3-6 weeks apart to maximally decrease your pain.  After the initial series, we generally will wait several months before considering a repeat injection of the same type.  If you have any questions, please call [phone] [facility]

## 2014-10-29 NOTE — Progress Notes (Signed)
Subjective:    Patient ID: Emily Kemp, female    DOB: 1940-03-16, 74 y.o.   MRN: [id number]  HPI   The patient is 74 year old female returns to pain management for further evaluation and treatment of pain involving the neck and entire back upper and lower extremity regions. On today's visit patient is accompanied by her husband patient states that she has had increased weakness of the lower extremities. Patient has difficulty performing activities such as standing walking. We discussed patient's condition. We will  request medical clearance for patient to undergo lumbar epidural steroid injection at time return appointment. The patient will continue Robaxin hydrocodone acetaminophen and methadone medications at this time. Patient tolerating medications well. We will also discussed patient participating in home exercise program. In addition to patient having therapists come to home to instruct patient on exercise program. We will continue present medications and proceed with lumbar epidural steroid injection at time return appointment pending medical clearance by Dr. Elijio Miles All were in agreement with suggested treatment plan    Review of Systems     Objective:   Physical Exam There was tenderness of the splenius capitis and occipitalis region of mild to moderate degree. No masses of the head and neck were noted. There was tenderness of the cervical facet and cervical paraspinal muscles of mild to moderate degree. Mild tenderness of the acromioclavicular glenohumeral joint region. Patient appeared to be with slightly decreased grip strength. There was tends to palpation of the medial and lateral epicondyles of the elbow of mild to moderate degree There was tenderness over the thoracic facet thoracic paraspinal muscles of moderate degree. There was no crepitus of the thoracic region noted. Palpation over the lumbar paraspinal muscle lumbar facet region reproduced moderate moderately severe  discomfort. With moderate moderately severe discomfort in the region of the lumbar paraspinal muscles region on the left as well as on the right. There was mild to moderate tends of the PSIS and PII S regions. Straight leg raising was tolerates approximately 20 with decreased EHL strength noted. There was questionably decreased sensation of the L5 dermatomal distribution. There appeared to be negative clonus negative Homans. Mild to moderate tenderness of the greater trochanteric region and iliotibial band region. Knee was a tends to palpation in crepitus of the knee with negative anterior and posterior drawer signs. The right knee was with the most significant tends to palpation noted. Abdomen nontender with no costovertebral angle tenderness noted.       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 Elijio Miles   F/U PCP Dr. Julieanne Manson evaliation of  BP and general medical  condition  F/U surgical evaluation. May consider pending follow-up evaluations  F/U neurological evaluation. May consider pending follow-up evaluations  Home exercise program was discussed on today's visit. We will have patient discuss renewing physical therapy for home exercise instructions and implementation of home exercise program  May consider radiofrequency rhizolysis or intraspinal procedures pending response to present  treatment and F/U evaluation   Patient to call [facility] should patient have concerns prior to scheduled return appointment.

## 2014-11-04 ENCOUNTER — Ambulatory Visit: Payer: No Typology Code available for payment source | Admitting: Pain Medicine

## 2014-11-08 ENCOUNTER — Encounter: Admission: RE | Payer: Self-pay | Source: Ambulatory Visit

## 2014-11-08 ENCOUNTER — Ambulatory Visit
Admission: RE | Admit: 2014-11-08 | Payer: No Typology Code available for payment source | Source: Ambulatory Visit | Admitting: Unknown Physician Specialty

## 2014-11-08 SURGERY — ESOPHAGOGASTRODUODENOSCOPY (EGD) WITH PROPOFOL
Anesthesia: General

## 2014-11-20 ENCOUNTER — Other Ambulatory Visit: Payer: Self-pay | Admitting: Unknown Physician Specialty

## 2014-11-20 DIAGNOSIS — R0981 Nasal congestion: Secondary | ICD-10-CM

## 2014-11-20 DIAGNOSIS — J342 Deviated nasal septum: Secondary | ICD-10-CM

## 2014-11-26 ENCOUNTER — Encounter: Payer: Self-pay | Admitting: Pain Medicine

## 2014-11-26 ENCOUNTER — Ambulatory Visit: Payer: Medicare Other | Attending: Pain Medicine | Admitting: Pain Medicine

## 2014-11-26 VITALS — BP 123/94 | HR 60 | Temp 98.2°F | Resp 16 | Ht <= 58 in | Wt 180.0 lb

## 2014-11-26 DIAGNOSIS — M5136 Other intervertebral disc degeneration, lumbar region: Secondary | ICD-10-CM | POA: Diagnosis not present

## 2014-11-26 DIAGNOSIS — M4806 Spinal stenosis, lumbar region: Secondary | ICD-10-CM | POA: Insufficient documentation

## 2014-11-26 DIAGNOSIS — M179 Osteoarthritis of knee, unspecified: Secondary | ICD-10-CM | POA: Diagnosis not present

## 2014-11-26 DIAGNOSIS — M5481 Occipital neuralgia: Secondary | ICD-10-CM

## 2014-11-26 DIAGNOSIS — M5030 Other cervical disc degeneration, unspecified cervical region: Secondary | ICD-10-CM | POA: Diagnosis not present

## 2014-11-26 DIAGNOSIS — M542 Cervicalgia: Secondary | ICD-10-CM | POA: Diagnosis present

## 2014-11-26 DIAGNOSIS — M170 Bilateral primary osteoarthritis of knee: Secondary | ICD-10-CM

## 2014-11-26 DIAGNOSIS — M533 Sacrococcygeal disorders, not elsewhere classified: Secondary | ICD-10-CM | POA: Insufficient documentation

## 2014-11-26 DIAGNOSIS — M5126 Other intervertebral disc displacement, lumbar region: Secondary | ICD-10-CM | POA: Insufficient documentation

## 2014-11-26 DIAGNOSIS — M546 Pain in thoracic spine: Secondary | ICD-10-CM | POA: Diagnosis present

## 2014-11-26 DIAGNOSIS — M48062 Spinal stenosis, lumbar region with neurogenic claudication: Secondary | ICD-10-CM

## 2014-11-26 MED ORDER — HYDROCODONE-ACETAMINOPHEN 7.5-325 MG PO TABS: ORAL_TABLET | ORAL | Status: DC

## 2014-11-26 MED ORDER — METHADONE HCL 5 MG PO TABS: ORAL_TABLET | ORAL | Status: DC

## 2014-11-26 MED ORDER — METHOCARBAMOL 500 MG PO TABS: ORAL_TABLET | ORAL | Status: DC

## 2014-11-26 NOTE — Patient Instructions (Signed)
PLAN   Continue present medication Robaxin hydrocodone acetaminophen and methadone  F/U PCP Dr.Tejan-Sie  for evaliation of  BP and general medical  condition  F/U surgical evaluation. May consider pending follow-up evaluations  F/U neurological evaluation. May consider pending follow-up evaluations  May consider radiofrequency rhizolysis or intraspinal procedures pending response to present treatment and F/U evaluation   Patient to call [facility] should patient have concerns prior to scheduled return appointment.

## 2014-11-26 NOTE — Progress Notes (Signed)
   Subjective:    Patient ID: Emily Kemp, female    DOB: Mar 12, 1940, 74 y.o.   MRN: [id number]  HPI  Patient is 74 year old female returns to [place] for further evaluation and treatment of pain involving the neck and entire back upper and lower extremity regions. The patient is accompanied by her husband on today's visit. The patient states that she has had significant improvement of her condition by taking meloxicam. We informed patient that she should avoid taking Mobidin due to the side effects of Mobidin including hepatic renal effects as well as gastric irritation. We informed patient she should address taking Mobidin with Dr. Elijio Miles and that she should continue her Protonix. We informed patient that we will let her primary care discuss physician decide if he wants her to take Motrin. We will continue patient's Robaxin hydrocodone acetaminophen and methadone. We informed patient that she may indeed benefit significantly from the use of meloxicam yet we emphasized to patient in the side effects including cardiovascular side effects which could be to risky for patient to consider taking. The patient was understanding and will follow-up with her primary care physician to discuss beginning meloxicam    Review of Systems     Objective:   Physical Exam  There was tennis over the splenius capitis and occipitalis muscles of mild degree. There was mild tenderness of the acromioclavicular glenohumeral joint region. There was mild tenderness of the cervical and paraspinal musculature regions. There was mild tenderness over the thoracic facet thoracic paraspinal musculature region with no crepitus of the thoracic region noted. Patient appeared to be with slightly decreased grip strength. Tinel and Phalen's maneuver were without increase of pain of significant degree. There was tenderness to palpation of the lumbar paraspinal musculature region lumbar facet region of moderate degree.  There was increased pain with extension and palpation over the lumbar facets. As well as tends to palpation of the PSIS and PII S region gluteal and piriformis musculature regions. There was tenderness to palpation of the knees with negative anterior and posterior drawer signs without ballottement of the patella. Straight leg raising limited to 20 without increased pain with dorsiflexion noted. Negative clonus negative Homans EHL strength decreased. No sensory deficit of dermatomal distribution detected.. 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 The patient will discuss taking meloxicam with Dr.Tejan-Sie . The patient was cautioned regarding the side effects of meloxicam. Meloxicam may be of significant benefit to patient. The patient should continue her Protonix and should await  discussing meloxicam with her primary care physician  F/U  PCP Dr Elijio Miles  for evaliation of  BP and general medical  condition  F/U surgical evaluation. May consider pending follow-up evaluations  F/U neurological evaluation. May consider pending follow-up evaluations  F/U Dr. Bridgett Larsson   May consider radiofrequency rhizolysis or intraspinal procedures pending response to present treatment and F/U evaluation   Patient to call [facility] Center should patient have concerns prior to scheduled return appointment.

## 2014-11-26 NOTE — Progress Notes (Signed)
Safety precautions to be maintained throughout the outpatient stay will include: orient to surroundings, keep bed in low position, maintain call bell within reach at all times, provide assistance with transfer out of bed and ambulation.  

## 2014-11-27 ENCOUNTER — Ambulatory Visit
Admission: RE | Admit: 2014-11-27 | Discharge: 2014-11-27 | Disposition: A | Discharge status: Home / Self Care | Payer: Medicare Other | Source: Ambulatory Visit | Attending: Unknown Physician Specialty | Admitting: Unknown Physician Specialty

## 2014-11-27 DIAGNOSIS — J342 Deviated nasal septum: Secondary | ICD-10-CM | POA: Diagnosis present

## 2014-11-27 DIAGNOSIS — R0981 Nasal congestion: Secondary | ICD-10-CM | POA: Diagnosis present

## 2014-12-24 ENCOUNTER — Ambulatory Visit: Payer: Medicare Other | Attending: Pain Medicine | Admitting: Pain Medicine

## 2014-12-24 ENCOUNTER — Encounter: Payer: Self-pay | Admitting: Pain Medicine

## 2014-12-24 VITALS — BP 122/55 | HR 58 | Temp 97.8°F | Resp 15 | Ht <= 58 in | Wt 180.0 lb

## 2014-12-24 DIAGNOSIS — M5481 Occipital neuralgia: Secondary | ICD-10-CM

## 2014-12-24 DIAGNOSIS — M5136 Other intervertebral disc degeneration, lumbar region: Secondary | ICD-10-CM

## 2014-12-24 DIAGNOSIS — M170 Bilateral primary osteoarthritis of knee: Secondary | ICD-10-CM

## 2014-12-24 DIAGNOSIS — M542 Cervicalgia: Secondary | ICD-10-CM | POA: Diagnosis present

## 2014-12-24 DIAGNOSIS — M5030 Other cervical disc degeneration, unspecified cervical region: Secondary | ICD-10-CM

## 2014-12-24 DIAGNOSIS — R51 Headache: Secondary | ICD-10-CM | POA: Insufficient documentation

## 2014-12-24 DIAGNOSIS — M51369 Other intervertebral disc degeneration, lumbar region without mention of lumbar back pain or lower extremity pain: Secondary | ICD-10-CM

## 2014-12-24 DIAGNOSIS — M48062 Spinal stenosis, lumbar region with neurogenic claudication: Secondary | ICD-10-CM

## 2014-12-24 MED ORDER — METHADONE HCL 5 MG PO TABS: ORAL_TABLET | ORAL | Status: DC

## 2014-12-24 MED ORDER — MELOXICAM 7.5 MG PO TABS: ORAL_TABLET | ORAL | Status: DC

## 2014-12-24 MED ORDER — METHOCARBAMOL 500 MG PO TABS: ORAL_TABLET | ORAL | Status: DC

## 2014-12-24 MED ORDER — HYDROCODONE-ACETAMINOPHEN 7.5-325 MG PO TABS: ORAL_TABLET | ORAL | Status: DC

## 2014-12-24 NOTE — Progress Notes (Signed)
   Subjective:    Patient ID: Emily Kemp, female    DOB: [birth year], 74 y.o.   MRN: [id number]  HPI  Patient is 74 year old female who returns to [place] for further evaluation and treatment of pain occurring in the region of the neck with pain of the neck radiating to the back of the head precipitating headaches. We discussed patient's condition and will consider patient for greater occipital nerve block at time return appointment. We may consider intraspinal procedure should patient be with persistent pain despite greater occipital nerve block with myoneural block injections. The patient was understanding and agreement with suggested treatment plan. Patient denies trauma change in events of daily living the call significant change in symptomatology. We will proceed with what is felt to be medically necessary procedure greater occipital nerve block at time return appointment in attempt to decrease severity of symptoms, minimize progression of symptoms, and avoid the need for more involved treatment. The patient was with understanding and in agreement with suggested treatment plan.   Review of Systems     Objective:   Physical Exam  there was moderate to moderately severe tenderness to palpation of the splenius capitis and occipitalis musculature regions. There appeared to be unremarkable Spurling's maneuver. Patient appeared to be with slightly decreased grip strength. Tinel and Phalen's maneuver were without increase of pain of significant degree. Palpation of the trapezius levator scapula and rhomboid musculature regions reproduced moderate to moderately severe discomfort. There was tends to palpation of the cervical facet cervical paraspinal musculature region and thoracic facet thoracic paraspinal musculature regions of moderate to moderately severe degree. No crepitus of the thoracic region was noted. Palpation over the lumbar paraspinal musculatures and lumbar facet region  was a tends to palpation of mild to moderate degree. Lateral bending and rotation extension and palpation of the lumbar facets reproduce mild to moderate discomfort. There was tends to palpation over the PSIS and PII S region of mild degree. Straight leg raising was tolerates approximately 20 without a definite increased pain with dorsiflexion noted. There was tenderness to palpation of the knee of mild to moderate degree. There appeared to be crepitus of the knee with negative anterior posterior drawer signs without ballottement of the patella. EHL strength appeared to be decreased and no definite sensory deficit of dermatomal distribution was detected. There was negative clonus negative Homans. Abdomen was nontender with no costovertebral angle tenderness noted        Assessment & Plan:     Continue present medication Robaxin hydrocodone acetaminophen and methadone.and begin meloxicam since Dr.  Elijio Miles wishes for patient to take meloxicam   Greater occipital nerve block to be performed at time return appointment  F/U  PCP Dr Elijio Miles  for evaliation of  BP and general medical  condition  F/U surgical evaluation. May consider pending follow-up evaluations  F/U neurological evaluation. May consider pending follow-up evaluations  F/U Dr. Bridgett Larsson   May consider radiofrequency rhizolysis or intraspinal procedures pending response to present treatment and F/U evaluation

## 2014-12-24 NOTE — Patient Instructions (Addendum)
PLAN   Continue present medication Robaxin hydrocodone acetaminophen and methadone and begin Mobidin as prescribed  Greater occipital nerve block to be performed at time return appointment  F/U PCP Dr.Tejan-Sie  for evaliation of  BP and general medical  condition  F/U surgical evaluation. May consider pending follow-up evaluations  F/U neurological evaluation. May consider pending follow-up evaluations  May consider radiofrequency rhizolysis or intraspinal procedures pending response to present treatment and F/U evaluation   [facility] to [facility] should patient have concerns prior to scheduled return appointment.Occipital Nerve Block Patient Information  Description: The occipital nerves originate in the cervical (neck) spinal cord and travel upward through muscle and tissue to supply sensation to the back of the head and top of the scalp.  In addition, the nerves control some of the muscles of the scalp.  Occipital neuralgia is an irritation of these nerves which can cause headaches, numbness of the scalp, and neck discomfort.     The occipital nerve block will interrupt nerve transmission through these nerves and can relieve pain and spasm.  The block consists of insertion of a small needle under the skin in the back of the head to deposit local anesthetic (numbing medicine) and/or steroids around the nerve.  The entire block usually lasts less than 5 minutes.  Conditions which may be treated by occipital blocks:   Muscular pain and spasm of the scalp  Nerve irritation, back of the head  Headaches  Upper neck pain  Preparation for the injection:  1. Do not eat any solid food or dairy products within 6 hours of your appointment. 2. You may drink clear liquids up to 2 hours before appointment.  Clear liquids include water, black coffee, juice or soda.  No milk or cream please. 3. You may take your regular medication, including pain medications, with a sip of  water before you appointment.  Diabetics should hold regular insulin (if taken separately) and take 1/2 normal NPH dose the morning of the procedure.  Carry some sugar containing items with you to your appointment. 4. A driver must accompany you and be prepared to drive you home after your procedure. 5. Bring all your current medications with you. 6. An IV may be inserted and sedation may be given at the discretion of the physician. 7. A blood pressure cuff, EKG, and other monitors will often be applied during the procedure.  Some patients may need to have extra oxygen administered for a short period. 8. You will be asked to provide medical information, including your allergies and medications, prior to the procedure.  We must know immediately if you are taking blood thinners (like Coumadin/Warfarin) or if you are allergic to IV iodine contrast (dye).  We must know if you could possible be pregnant.  9. Do not wear a high collared shirt or turtleneck.  Tie long hair up in the back if possible.  Possible side-effects:   Bleeding from needle site  Infection (rare, may require surgery)  Nerve injury (rare)  Hair on back of neck can be tinged with iodine scrub (this will wash out)  Light-headedness (temporary)  Pain at injection site (several days)  Decreased blood pressure (rare, temporary)  Seizure (very rare)  Call if you experience:   Hives or difficulty breathing ( go to the emergency room)  Inflammation or drainage at the injection site(s)  Please note:  Although the local anesthetic injected can often make your painful muscles or headache feel good for several  hours after the injection, the pain may return.  It takes 3-7 days for steroids to work.  You may not notice any pain relief for at least one week.  If effective, we will often do a series of injections spaced 3-6 weeks apart to maximally decrease your pain.  If you have any questions, please call [phone]  [phone] [place]  What are the risk, side effects and possible complications? Generally speaking, most procedures are safe.  However, with any procedure there are risks, side effects, and the possibility of complications.  The risks and complications are dependent upon the sites that are lesioned, or the type of nerve block to be performed.  The closer the procedure is to the spine, the more serious the risks are.  Great care is taken when placing the radio frequency needles, block needles or lesioning probes, but sometimes complications can occur. 1. Infection: Any time there is an injection through the skin, there is a risk of infection.  This is why sterile conditions are used for these blocks.  There are four possible types of infection. 1. Localized skin infection. 2. Central Nervous System Infection-This can be in the form of Meningitis, which can be deadly. 3. Epidural Infections-This can be in the form of an epidural abscess, which can cause pressure inside of the spine, causing compression of the spinal cord with subsequent paralysis. This would require an emergency surgery to decompress, and there are no guarantees that the patient would recover from the paralysis. 4. Discitis-This is an infection of the intervertebral discs.  It occurs in about 1% of discography procedures.  It is difficult to treat and it may lead to surgery.        2. Pain: the needles have to go through skin and soft tissues, will cause soreness.       3. Damage to internal structures:  The nerves to be lesioned may be near blood vessels or    other nerves which can be potentially damaged.       4. Bleeding: Bleeding is more common if the patient is taking blood thinners such as  aspirin, Coumadin, Ticiid, Plavix, etc., or if he/she have some genetic predisposition  such as hemophilia. Bleeding into the spinal canal can cause compression of the spinal   cord with subsequent paralysis.  This would require an emergency surgery to  decompress and there are no guarantees that the patient would recover from the  paralysis.       5. Pneumothorax:  Puncturing of a lung is a possibility, every time a needle is introduced in  the area of the chest or upper back.  Pneumothorax refers to free air around the  collapsed lung(s), inside of the thoracic cavity (chest cavity).  Another two possible  complications related to a similar event would include: Hemothorax and Chylothorax.   These are variations of the Pneumothorax, where instead of air around the collapsed  lung(s), you may have blood or chyle, respectively.       6. Spinal headaches: They may occur with any procedures in the area of the spine.       7. Persistent CSF (Cerebro-Spinal Fluid) leakage: This is a rare problem, but may occur  with prolonged intrathecal or epidural catheters either due to the formation of a fistulous  track or a dural tear.       8. Nerve damage: By working so close to the spinal cord, there  is always a possibility of  nerve damage, which could be as serious as a permanent spinal cord injury with  paralysis.       9. Death:  Although rare, severe deadly allergic reactions known as "Anaphylactic  reaction" can occur to any of the medications used.      10. Worsening of the symptoms:  We can always make thing worse.  What are the chances of something like this happening? Chances of any of this occuring are extremely low.  By statistics, you have more of a chance of getting killed in a motor vehicle accident: while driving to the hospital than any of the above occurring .  Nevertheless, you should be aware that they are possibilities.  In general, it is similar to taking a shower.  Everybody knows that you can slip, hit your head and get killed.  Does that mean that you should not shower again?  Nevertheless always keep in mind that statistics do not mean anything if you happen to be on  the wrong side of them.  Even if a procedure has a 1 (one) in a 1,000,000 (million) chance of going wrong, it you happen to be that one..Also, keep in mind that by statistics, you have more of a chance of having something go wrong when taking medications.  Who should not have this procedure? If you are on a blood thinning medication (e.g. Coumadin, Plavix, see list of "Blood Thinners"), or if you have an active infection going on, you should not have the procedure.  If you are taking any blood thinners, please inform your physician.  How should I prepare for this procedure?  Do not eat or drink anything at least six hours prior to the procedure.  Bring a driver with you .  It cannot be a taxi.  Come accompanied by an adult that can drive you back, and that is strong enough to help you if your legs get weak or numb from the local anesthetic.  Take all of your medicines the morning of the procedure with just enough water to swallow them.  If you have diabetes, make sure that you are scheduled to have your procedure done first thing in the morning, whenever possible.  If you have diabetes, take only half of your insulin dose and notify our nurse that you have done so as soon as you arrive at the clinic.  If you are diabetic, but only take blood sugar pills (oral hypoglycemic), then do not take them on the morning of your procedure.  You may take them after you have had the procedure.  Do not take aspirin or any aspirin-containing medications, at least eleven (11) days prior to the procedure.  They may prolong bleeding.  Wear loose fitting clothing that may be easy to take off and that you would not mind if it got stained with Betadine or blood.  Do not wear any jewelry or perfume  Remove any nail coloring.  It will interfere with some of our monitoring equipment.  NOTE: Remember that this is not meant to be interpreted as a complete list of all possible complications.  Unforeseen problems  may occur.  BLOOD THINNERS The following drugs contain aspirin or other products, which can cause increased bleeding during surgery and should not be taken for 2 weeks prior to and 1 week after surgery.  If you should need take something for relief of minor pain, you may take acetaminophen which is found in Tylenol,m Datril, Anacin-3  and Panadol. It is not blood thinner. The products listed below are.  Do not take any of the products listed below in addition to any listed on your instruction sheet.  A.P.C or A.P.C with Codeine Codeine Phosphate Capsules #3 Ibuprofen Ridaura  ABC compound Congesprin Imuran rimadil  Advil Cope Indocin Robaxisal  Alka-Seltzer Effervescent Pain Reliever and Antacid Coricidin or Coricidin-D  Indomethacin Rufen  Alka-Seltzer plus Cold Medicine Cosprin Ketoprofen S-A-C Tablets  Anacin Analgesic Tablets or Capsules Coumadin Korlgesic Salflex  Anacin Extra Strength Analgesic tablets or capsules CP-2 Tablets Lanoril Salicylate  Anaprox Cuprimine Capsules Levenox Salocol  Anexsia-D Dalteparin Magan Salsalate  Anodynos Darvon compound Magnesium Salicylate Sine-off  Ansaid Dasin Capsules Magsal Sodium Salicylate  Anturane Depen Capsules Marnal Soma  APF Arthritis pain formula Dewitt's Pills Measurin Stanback  Argesic Dia-Gesic Meclofenamic Sulfinpyrazone  Arthritis Bayer Timed Release Aspirin Diclofenac Meclomen Sulindac  Arthritis pain formula Anacin Dicumarol Medipren Supac  Analgesic (Safety coated) Arthralgen Diffunasal Mefanamic Suprofen  Arthritis Strength Bufferin Dihydrocodeine Mepro Compound Suprol  Arthropan liquid Dopirydamole Methcarbomol with Aspirin Synalgos  ASA tablets/Enseals Disalcid Micrainin Tagament  Ascriptin Doan's Midol Talwin  Ascriptin A/D Dolene Mobidin Tanderil  Ascriptin Extra Strength Dolobid Moblgesic Ticlid  Ascriptin with Codeine Doloprin or Doloprin with Codeine Momentum Tolectin  Asperbuf Duoprin Mono-gesic Trendar  Aspergum  Duradyne Motrin or Motrin IB Triminicin  Aspirin plain, buffered or enteric coated Durasal Myochrisine Trigesic  Aspirin Suppositories Easprin Nalfon Trillsate  Aspirin with Codeine Ecotrin Regular or Extra Strength Naprosyn Uracel  Atromid-S Efficin Naproxen Ursinus  Auranofin Capsules Elmiron Neocylate Vanquish  Axotal Emagrin Norgesic Verin  Azathioprine Empirin or Empirin with Codeine Normiflo Vitamin E  Azolid Emprazil Nuprin Voltaren  Bayer Aspirin plain, buffered or children's or timed BC Tablets or powders Encaprin Orgaran Warfarin Sodium  Buff-a-Comp Enoxaparin Orudis Zorpin  Buff-a-Comp with Codeine Equegesic Os-Cal-Gesic   Buffaprin Excedrin plain, buffered or Extra Strength Oxalid   Bufferin Arthritis Strength Feldene Oxphenbutazone   Bufferin plain or Extra Strength Feldene Capsules Oxycodone with Aspirin   Bufferin with Codeine Fenoprofen Fenoprofen Pabalate or Pabalate-SF   Buffets II Flogesic Panagesic   Buffinol plain or Extra Strength Florinal or Florinal with Codeine Panwarfarin   Buf-Tabs Flurbiprofen Penicillamine   Butalbital Compound Four-way cold tablets Penicillin   Butazolidin Fragmin Pepto-Bismol   Carbenicillin Geminisyn Percodan   Carna Arthritis Reliever Geopen Persantine   Carprofen Gold's salt Persistin   Chloramphenicol Goody's Phenylbutazone   Chloromycetin Haltrain Piroxlcam   Clmetidine heparin Plaquenil   Cllnoril Hyco-pap Ponstel   Clofibrate Hydroxy chloroquine Propoxyphen         Before stopping any of these medications, be sure to consult the physician who ordered them.  Some, such as Coumadin (Warfarin) are ordered to prevent or treat serious conditions such as "deep thrombosis", "pumonary embolisms", and other heart problems.  The amount of time that you may need off of the medication may also vary with the medication and the reason for which you were taking it.  If you are taking any of these medications, please make sure you notify your pain  physician before you undergo any procedures.

## 2014-12-24 NOTE — Progress Notes (Signed)
Safety precautions to be maintained throughout the outpatient stay will include: orient to surroundings, keep bed in low position, maintain call bell within reach at all times, provide assistance with transfer out of bed and ambulation.  

## 2015-01-01 ENCOUNTER — Ambulatory Visit: Payer: Medicare Other | Attending: Pain Medicine | Admitting: Pain Medicine

## 2015-01-01 ENCOUNTER — Encounter: Payer: Self-pay | Admitting: Pain Medicine

## 2015-01-01 VITALS — BP 109/80 | HR 61 | Temp 97.6°F | Resp 18 | Ht <= 58 in | Wt 186.0 lb

## 2015-01-01 DIAGNOSIS — M5030 Other cervical disc degeneration, unspecified cervical region: Secondary | ICD-10-CM | POA: Insufficient documentation

## 2015-01-01 DIAGNOSIS — R51 Headache: Secondary | ICD-10-CM | POA: Diagnosis present

## 2015-01-01 DIAGNOSIS — M170 Bilateral primary osteoarthritis of knee: Secondary | ICD-10-CM

## 2015-01-01 DIAGNOSIS — M542 Cervicalgia: Secondary | ICD-10-CM | POA: Diagnosis present

## 2015-01-01 DIAGNOSIS — M48062 Spinal stenosis, lumbar region with neurogenic claudication: Secondary | ICD-10-CM

## 2015-01-01 DIAGNOSIS — M5481 Occipital neuralgia: Secondary | ICD-10-CM

## 2015-01-01 DIAGNOSIS — M5136 Other intervertebral disc degeneration, lumbar region: Secondary | ICD-10-CM

## 2015-01-01 MED ORDER — TRIAMCINOLONE ACETONIDE 40 MG/ML IJ SUSP
INTRAMUSCULAR | Status: AC
  Filled 2015-01-01: qty 1

## 2015-01-01 MED ORDER — BUPIVACAINE HCL (PF) 0.25 % IJ SOLN
INTRAMUSCULAR | Status: AC
  Filled 2015-01-01: qty 30

## 2015-01-01 MED ORDER — ORPHENADRINE CITRATE 30 MG/ML IJ SOLN
INTRAMUSCULAR | Status: AC
  Filled 2015-01-01: qty 2

## 2015-01-01 NOTE — Progress Notes (Signed)
   Subjective:    Patient ID: Emily Kemp, female    DOB: 11-30-1940, 74 y.o.   MRN: [id number]  HPI  NOTE: The patient is a 74 y.o.-year-old female who [facility] evaluation and treatment of pain consisting of pain involving the region of the neck and headache.  Patient is with prior studies revealing patient to be with degenerative changes of the cervical spine. The patient is a severe pain radiating from the neck to the posterior aspect of the hand precipitating headaches. There is concern regarding significant component of patient's pain being due to greater occipital neuralgia. .  The risks, benefits, and expectations of the procedure have been discussed and explained to patient, who is understanding and wishes to proceed with interventional treatment as discussed and as explained to patient.  Will proceed with greater occipital nerve blocks with myoneural block injections at this time as discussed and as explained to patient.  All are understanding and in agreement with suggested treatment plan.    PROCEDURE:  Greater occipital nerve block on the left side with IV Versed, IV Fentanyl, conscious sedation, EKG, blood pressure, pulse, pulse oximetry monitoring.  Procedure performed with patient in prone position.  Greater occipital nerve block on the left side.   With patient in prone position, Betadine prep of proposed entry site accomplished.  Following identification of the nuchal ridge, 22 -gauge needle was inserted at the level of the nuchal ridge medial to the occipital artery.  Following negative aspiration, 4cc 0.25% bupivacaine with Kenalog injected for left greater occipital nerve block.  Needle was removed.  Patient tolerated injection well.   Greater occipital nerve block on the rightt side. The greater occipital nerve block on the right side was performed exactly as the left greater occipital nerve block was performed and utilizing the same  technique.   Myoneural block injections of the cervical region.  Following Betadine prep proposed entry site, a 22-gauge needle was inserted in the cervical paraspinal musculature region and following negative aspiration 2 cc of 0.25% bupivacaine with Norflex was injected for myoneural block injection cervical region 2  The patient tolerated procedure well   A total of 10 mg Kenalog was utilized for the entire procedure.  PLAN:    1. Medications: Will continue presently prescribed medications at this time. 2. Patient to follow up with primary care physician Dr.Tejan-Sie  for evaluation of blood pressure and general medical condition status post procedure performed on today's visit. 3. Neurological evaluation for further assessment of headaches for further studies as discussed. 4. Surgical evaluation as discussed.  5. Patient may be candidate for Botox injections, radiofrequency procedures, as well as implantation type procedures pending response to treatment rendered on today's visit and pending follow-up evaluation. 6. Patient has been advised to adhere to proper body mechanics and to avoid activities which appear to aggravate condition.cations:  Will continue presently prescribed medications at this time. 7. The patient is understanding and in agreement with the suggested treatment plan.       Review of Systems     Objective:   Physical Exam        Assessment & Plan:

## 2015-01-01 NOTE — Patient Instructions (Addendum)
PLAN   Continue present medication Robaxin hydrocodone acetaminophen and methadone and begin Mobidin as prescribed  F/U PCP Dr.Tejan-Sie  for evaliation of  BP and general medical  condition  F/U surgical evaluation. May consider pending follow-up evaluations  F/U neurological evaluation. May consider pending follow-up evaluations  May consider radiofrequency rhizolysis or intraspinal procedures pending response to present treatment and F/U evaluation   Patient to call Pain Management Center should patient have concerns prior to scheduled return appointment.Pain Management Discharge Instructions  General Discharge Instructions :  If you need to reach your doctor call: Monday-Friday 8:00 am - 4:00 pm at [phone] or toll free [phone].  After clinic hours [phone] to have operator reach doctor.  Bring all of your medication bottles to all your appointments in the pain clinic.  To cancel or reschedule your appointment with Pain Management please remember to call 24 hours in advance to avoid a fee.  Refer to the educational materials which you have been given on: General Risks, I had my Procedure. Discharge Instructions, Post Sedation.  Post Procedure Instructions:  The drugs you were given will stay in your system until tomorrow, so for the next 24 hours you should not drive, make any legal decisions or drink any alcoholic beverages.  You may eat anything you prefer, but it is better to start with liquids then soups and crackers, and gradually work up to solid foods.  Please notify your doctor immediately if you have any unusual bleeding, trouble breathing or pain that is not related to your normal pain.  Depending on the type of procedure that was done, some parts of your body may feel week and/or numb.  This usually clears up by tonight or the next day.  Walk with the use of an assistive device or accompanied by an adult for the 24 hours.  You may use ice on the  affected area for the first 24 hours.  Put ice in a Ziploc bag and cover with a towel and place against area 15 minutes on 15 minutes off.  You may switch to heat after 24 hours.Occipital Nerve Block Patient Information  Description: The occipital nerves originate in the cervical (neck) spinal cord and travel upward through muscle and tissue to supply sensation to the back of the head and top of the scalp.  In addition, the nerves control some of the muscles of the scalp.  Occipital neuralgia is an irritation of these nerves which can cause headaches, numbness of the scalp, and neck discomfort.     The occipital nerve block will interrupt nerve transmission through these nerves and can relieve pain and spasm.  The block consists of insertion of a small needle under the skin in the back of the head to deposit local anesthetic (numbing medicine) and/or steroids around the nerve.  The entire block usually lasts less than 5 minutes.  Conditions which may be treated by occipital blocks:   Muscular pain and spasm of the scalp  Nerve irritation, back of the head  Headaches  Upper neck pain  Preparation for the injection:  1. Do not eat any solid food or dairy products within 6 hours of your appointment. 2. You may drink clear liquids up to 2 hours before appointment.  Clear liquids include water, black coffee, juice or soda.  No milk or cream please. 3. You may take your regular medication, including pain medications, with a sip of water before you appointment.  Diabetics should hold regular insulin (if taken separately)  and take 1/2 normal NPH dose the morning of the procedure.  Carry some sugar containing items with you to your appointment. 4. A driver must accompany you and be prepared to drive you home after your procedure. 5. Bring all your current medications with you. 6. An IV may be inserted and sedation may be given at the discretion of the physician. 7. A blood pressure cuff, EKG, and  other monitors will often be applied during the procedure.  Some patients may need to have extra oxygen administered for a short period. 8. You will be asked to provide medical information, including your allergies and medications, prior to the procedure.  We must know immediately if you are taking blood thinners (like Coumadin/Warfarin) or if you are allergic to IV iodine contrast (dye).  We must know if you could possible be pregnant.  9. Do not wear a high collared shirt or turtleneck.  Tie long hair up in the back if possible.  Possible side-effects:   Bleeding from needle site  Infection (rare, may require surgery)  Nerve injury (rare)  Hair on back of neck can be tinged with iodine scrub (this will wash out)  Light-headedness (temporary)  Pain at injection site (several days)  Decreased blood pressure (rare, temporary)  Seizure (very rare)  Call if you experience:   Hives or difficulty breathing ( go to the emergency room)  Inflammation or drainage at the injection site(s)  Please note:  Although the local anesthetic injected can often make your painful muscles or headache feel good for several hours after the injection, the pain may return.  It takes 3-7 days for steroids to work.  You may not notice any pain relief for at least one week.  If effective, we will often do a series of injections spaced 3-6 weeks apart to maximally decrease your pain.  If you have any questions, please call [phone] [facility]

## 2015-01-02 ENCOUNTER — Telehealth: Payer: Self-pay | Admitting: *Deleted

## 2015-01-02 NOTE — Telephone Encounter (Signed)
Patient verbalizes no complications from procedure.  

## 2015-01-05 IMAGING — MG MM MAMMO DIAGNOSTIC UNILATERAL*R*
4 series · 4 of 4 positions shown · non-contrast
Comparison: 07/10/2013, 06/29/2013, 05/25/2012 and 05/14/2011

CLINICAL DATA: Six-month follow-up of probably benign right breast
calcification

EXAM:
DIGITAL DIAGNOSTIC  RIGHT MAMMOGRAM WITH CAD

[R MLO]
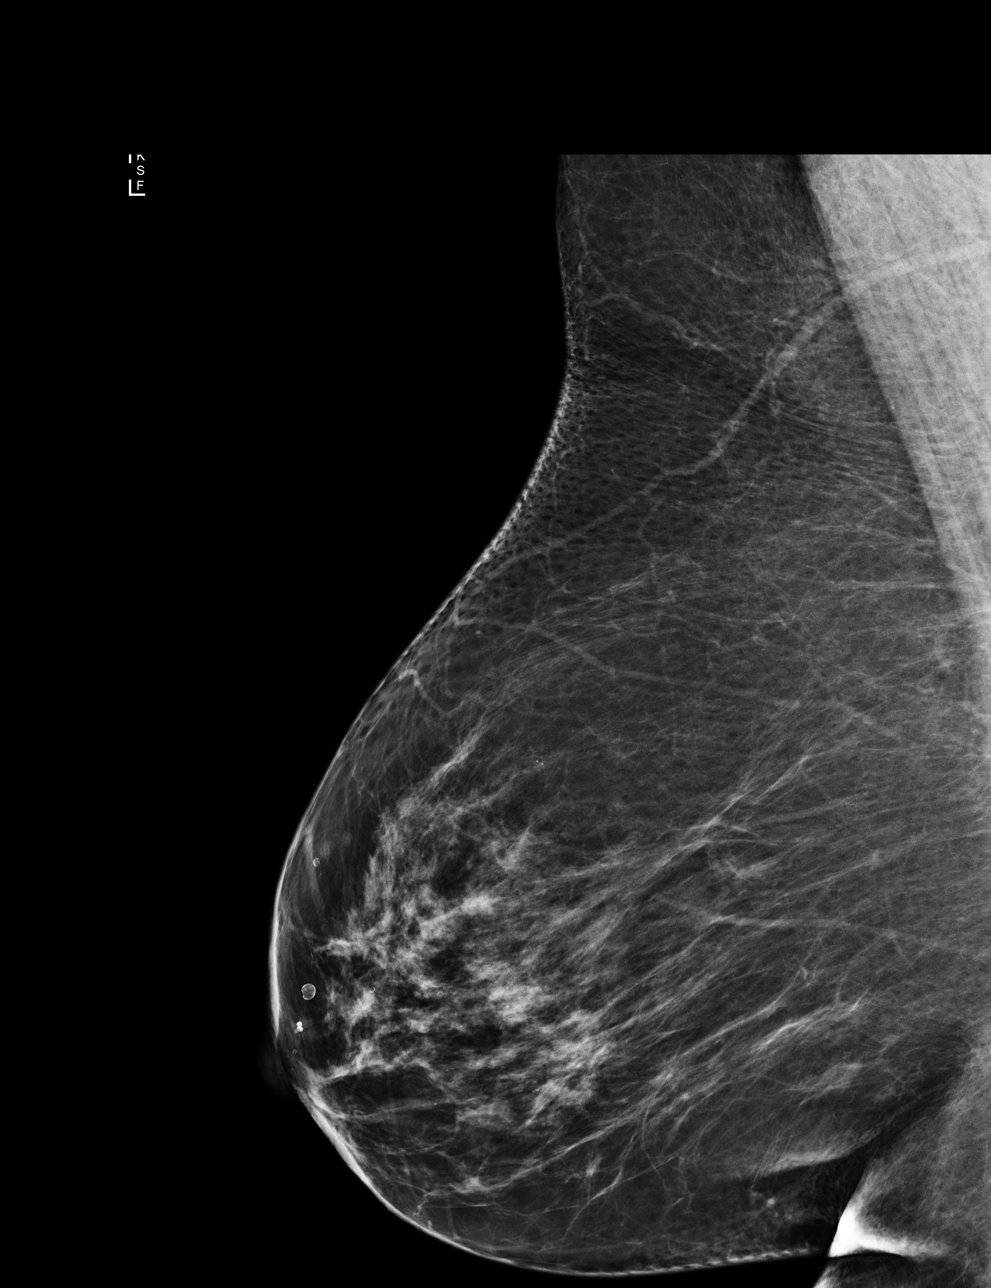

[R CC (1 of 2)]
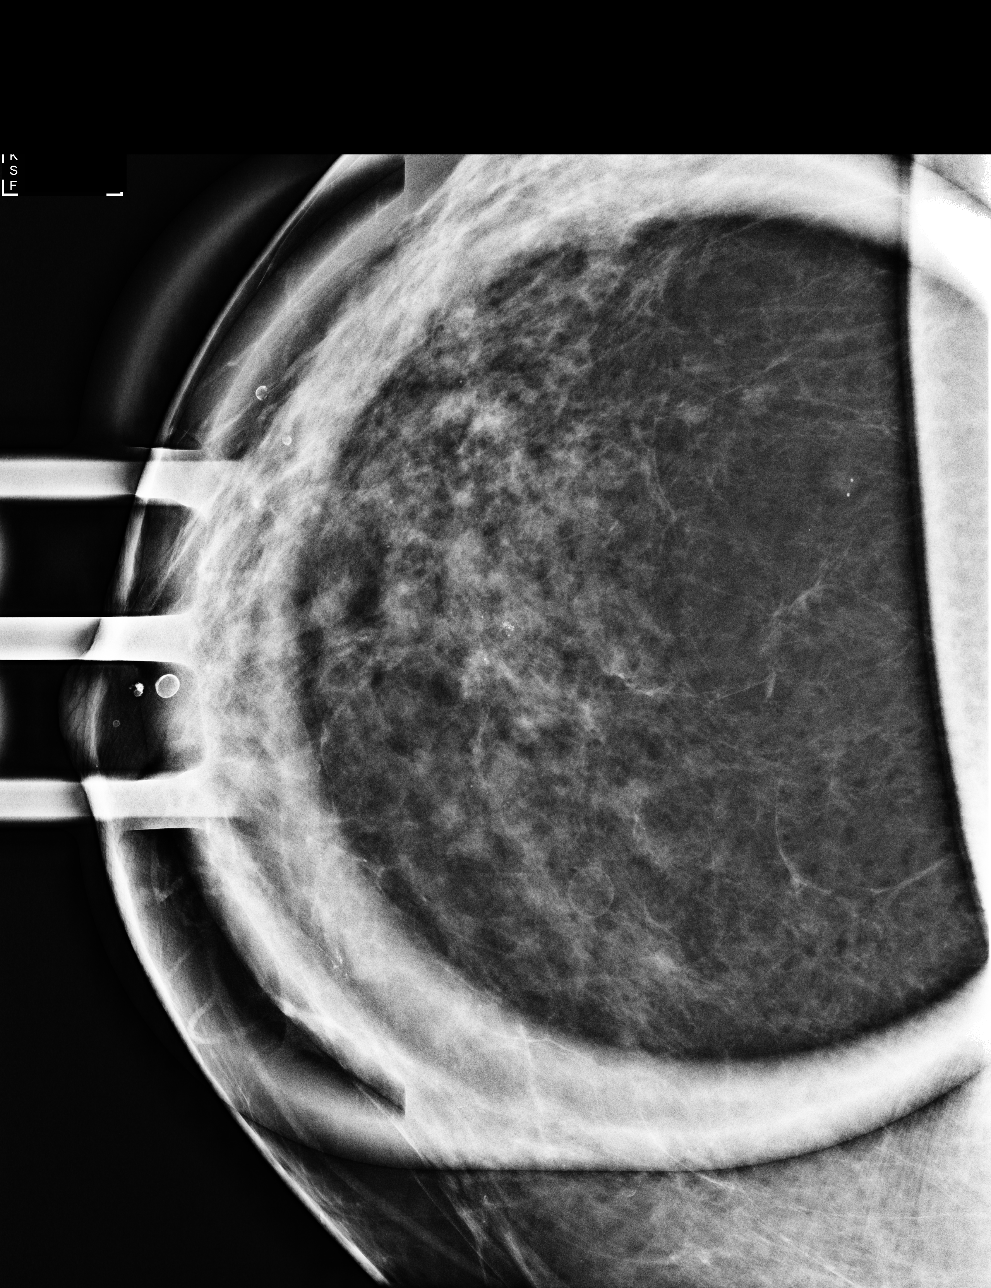

[R CC (2 of 2)]
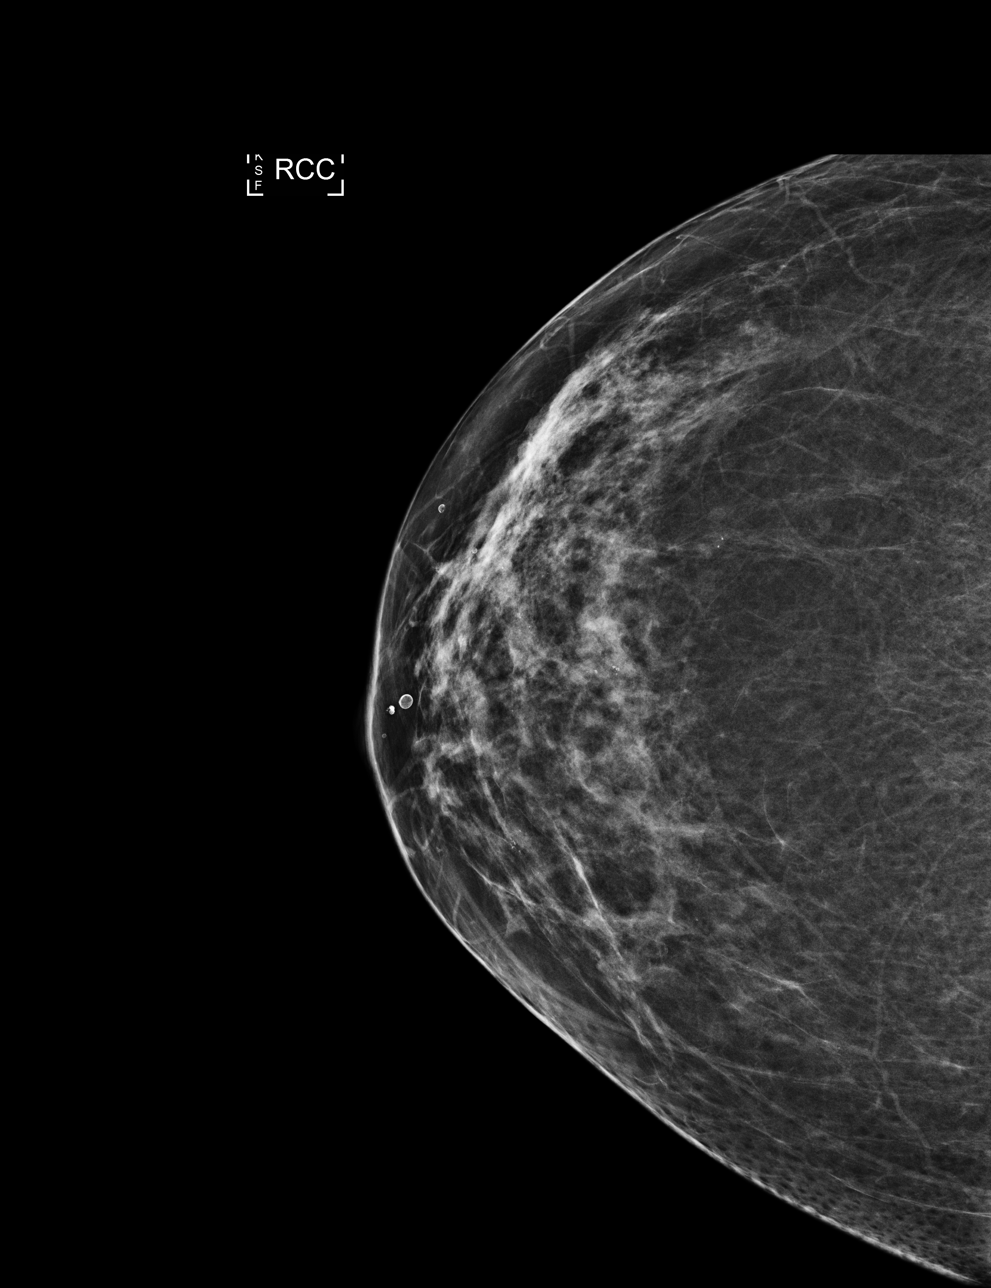

[R ML]
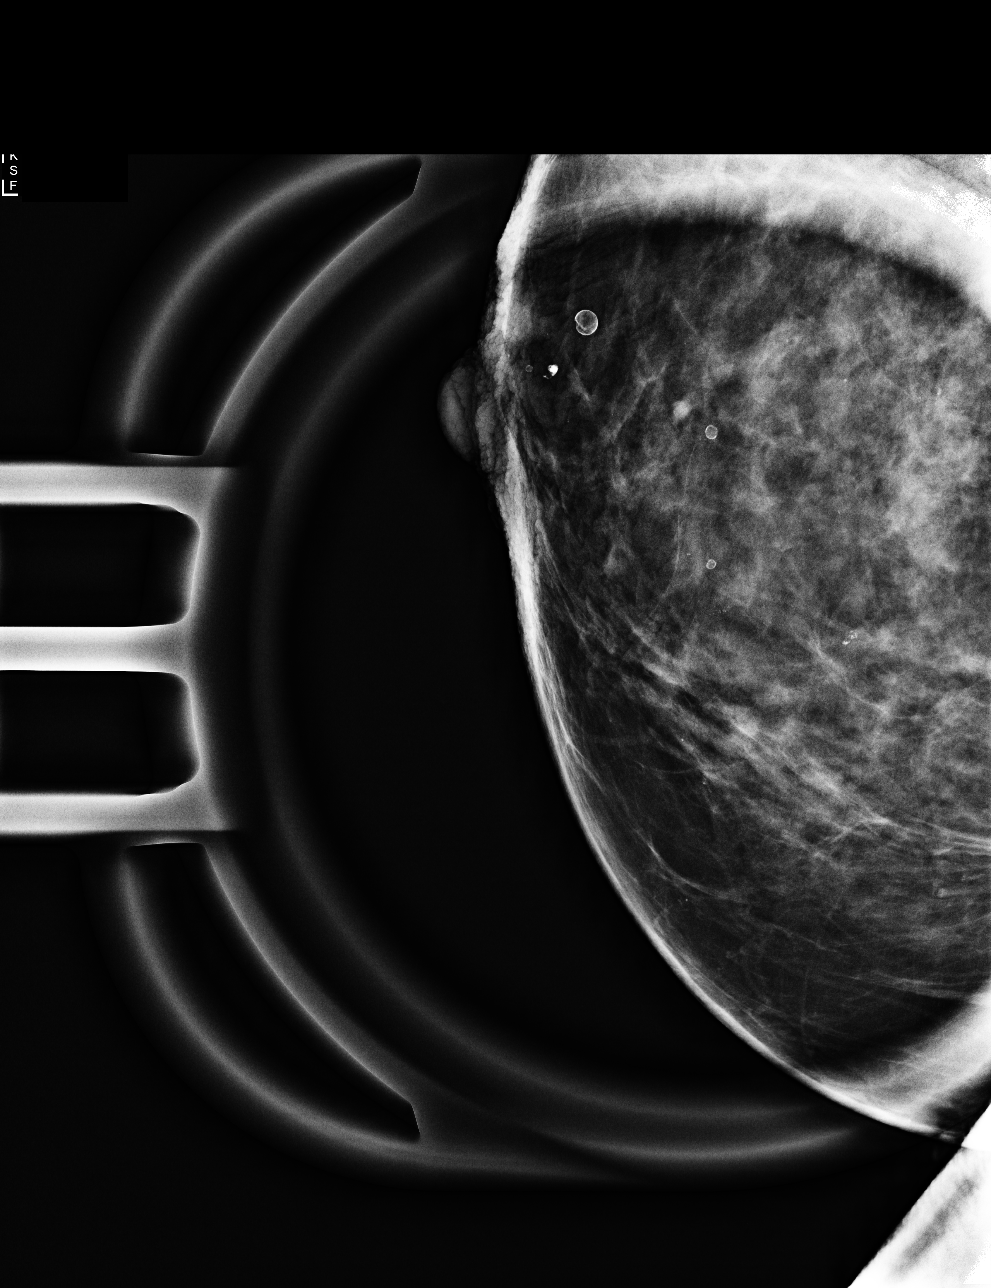

[4 of 4 positions shown; findings below may reference images not displayed]

ACR Breast Density Category c: The breast tissue is heterogeneously
dense, which may obscure small masses.
FINDINGS: The patient returns for follow-up of probably benign calcifications
identified in the anterior and lower inner quadrant of the right
breast. These calcifications are probably benign and may represent
dystrophic type calcifications. Calcifications may have slightly
decreased in number when compared to most recent prior. No
suspicious mass or distortion is identified.

Mammographic images were processed with CAD.
IMPRESSION: Right breast calcifications are probably benign.

RECOMMENDATION:
Recommend an additional six-month follow-up diagnostic right breast
mammogram. A normal annual left mammogram is due at that time.

I have discussed the findings and recommendations with the patient.
Results were also provided in writing at the conclusion of the
visit. If applicable, a reminder letter will be sent to the patient
regarding the next appointment.

BI-RADS CATEGORY  3: Probably benign finding(s) - short interval
follow-up suggested.

## 2015-01-06 DIAGNOSIS — R413 Other amnesia: Secondary | ICD-10-CM | POA: Insufficient documentation

## 2015-01-06 DIAGNOSIS — R251 Tremor, unspecified: Secondary | ICD-10-CM | POA: Insufficient documentation

## 2015-01-13 ENCOUNTER — Other Ambulatory Visit: Payer: Self-pay | Admitting: Pain Medicine

## 2015-01-21 ENCOUNTER — Ambulatory Visit: Payer: No Typology Code available for payment source | Admitting: Pain Medicine

## 2015-01-27 ENCOUNTER — Other Ambulatory Visit: Payer: Self-pay | Admitting: Internal Medicine

## 2015-01-27 DIAGNOSIS — Z1231 Encounter for screening mammogram for malignant neoplasm of breast: Secondary | ICD-10-CM

## 2015-01-31 ENCOUNTER — Other Ambulatory Visit: Payer: Self-pay | Admitting: Internal Medicine

## 2015-01-31 DIAGNOSIS — Z1231 Encounter for screening mammogram for malignant neoplasm of breast: Secondary | ICD-10-CM

## 2015-02-03 ENCOUNTER — Other Ambulatory Visit: Payer: Self-pay | Admitting: Internal Medicine

## 2015-02-03 DIAGNOSIS — R921 Mammographic calcification found on diagnostic imaging of breast: Secondary | ICD-10-CM

## 2015-02-04 ENCOUNTER — Ambulatory Visit: Payer: No Typology Code available for payment source | Admitting: Pain Medicine

## 2015-02-11 ENCOUNTER — Encounter: Payer: Self-pay | Admitting: Pain Medicine

## 2015-02-11 ENCOUNTER — Ambulatory Visit: Payer: Medicare Other | Attending: Pain Medicine | Admitting: Pain Medicine

## 2015-02-11 VITALS — BP 105/63 | HR 71 | Temp 96.3°F | Resp 16 | Wt 187.0 lb

## 2015-02-11 DIAGNOSIS — M48062 Spinal stenosis, lumbar region with neurogenic claudication: Secondary | ICD-10-CM

## 2015-02-11 DIAGNOSIS — M179 Osteoarthritis of knee, unspecified: Secondary | ICD-10-CM | POA: Insufficient documentation

## 2015-02-11 DIAGNOSIS — M5136 Other intervertebral disc degeneration, lumbar region: Secondary | ICD-10-CM | POA: Insufficient documentation

## 2015-02-11 DIAGNOSIS — M533 Sacrococcygeal disorders, not elsewhere classified: Secondary | ICD-10-CM | POA: Diagnosis not present

## 2015-02-11 DIAGNOSIS — M5481 Occipital neuralgia: Secondary | ICD-10-CM

## 2015-02-11 DIAGNOSIS — M5126 Other intervertebral disc displacement, lumbar region: Secondary | ICD-10-CM | POA: Insufficient documentation

## 2015-02-11 DIAGNOSIS — M5030 Other cervical disc degeneration, unspecified cervical region: Secondary | ICD-10-CM

## 2015-02-11 DIAGNOSIS — M51369 Other intervertebral disc degeneration, lumbar region without mention of lumbar back pain or lower extremity pain: Secondary | ICD-10-CM

## 2015-02-11 DIAGNOSIS — M170 Bilateral primary osteoarthritis of knee: Secondary | ICD-10-CM

## 2015-02-11 DIAGNOSIS — M549 Dorsalgia, unspecified: Secondary | ICD-10-CM | POA: Diagnosis present

## 2015-02-11 DIAGNOSIS — M4806 Spinal stenosis, lumbar region: Secondary | ICD-10-CM | POA: Diagnosis not present

## 2015-02-11 DIAGNOSIS — M542 Cervicalgia: Secondary | ICD-10-CM | POA: Diagnosis present

## 2015-02-11 MED ORDER — HYDROCODONE-ACETAMINOPHEN 7.5-325 MG PO TABS: ORAL_TABLET | ORAL | Status: DC

## 2015-02-11 MED ORDER — METHADONE HCL 5 MG PO TABS: ORAL_TABLET | ORAL | Status: DC

## 2015-02-11 MED ORDER — MELOXICAM 7.5 MG PO TABS: ORAL_TABLET | ORAL | Status: DC

## 2015-02-11 NOTE — Patient Instructions (Addendum)
PLAN   Continue present medication Robaxin hydrocodone acetaminophen and methadone and limit Mobic as discussed   F/U PCP Dr.Tejan-Sie  for evaliation of  BP and general medical  condition  F/U surgical evaluation. May consider pending follow-up evaluations  F/U neurological evaluation. May consider pending follow-up evaluations  Use TENS unit as discussed and trying to perform mild exercises such as gentle range of motion, stretching and massage  May consider radiofrequency rhizolysis or intraspinal procedures pending response to present treatment and F/U evaluation   Patient to call Pain Management Center should patient have concerns prior to scheduled return appointment

## 2015-02-11 NOTE — Progress Notes (Signed)
   Subjective:    Patient ID: Emily Kemp, female    DOB: [birth year], 74 y.o.   MRN: [id number]  HPI  The patient is a 74 year old female who returns to pain management Center for further evaluation and treatment of pain involving the neck entire back upper and lower extremity region. The patient has had some improvement of headaches with prior greater occipital nerve blocks. The patient states that the pain overall has improved involving multiple regions. He discussed patient exercising caution to avoid aggravation of symptomatology. We will also reviewed patient's medications and will consider modifications of treatment pending follow-up evaluation. The patient is with minimal use of medications at this time and states that she appears to be with pain well-controlled. We remain available to consider patient for modification of treatment regimen pending follow-up evaluation as discussed and explained to patient on today's visit. Patient is to call pain management Center prior to scheduled return appointment should there be significant change in condition. The patient is understanding and agreed to suggested treatment plan.     Review of Systems     Objective:   Physical Exam   Was tennis to palpation of the splenius capitis and occipitalis musculature region palpation which reproduces pain of mild degree. There appeared to be unremarkable Spurling's maneuver. There was tends to palpation over the cervical facet cervical paraspinal musculature region a mild to moderate degree. Tinel and Phalen's maneuver were without increased pain of significant degree and patient appeared to be with slightly decreased grip strength. Palpation over the thoracic facet thoracic paraspinal must reason was with mild to moderate discomfort. No crepitus of the thoracic region was noted. Palpation over the lumbar paraspinal misreading lumbar facet region was of increased pain with lateral bending rotation extension  and palpation of the lumbar facets. There was tenderness over the greater trochanteric region iliotibial band region a mild degree. Straight leg raise was tolerates approximately 20 without increased pain with dorsiflexion noted. The knees were attends to palpation with crepitus of the knees with negative anterior and posterior drawer signs without ballottement of the patella. No sensory deficit or dermatomal distribution detected. EHL strength was slightly decreased There was negative clonus negative Homans. Abdomen nontender with no costovertebral tenderness noted.         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 May consider pending follow-up evaluations  F/U neurological evaluation. May consider pending follow-up evaluations  Use TENS unit as discussed and trying to perform mild exercises such as gentle range of motion, stretching and massage  May consider radiofrequency rhizolysis or intraspinal procedures pending response to present treatment and F/U evaluation   Patient to call Pain Management Center should patient have concerns prior to scheduled return appointment Degenerative disc disease cervical spine  Cervical facet syndrome

## 2015-02-11 NOTE — Progress Notes (Signed)
Safety precautions to be maintained throughout the outpatient stay will include: orient to surroundings, keep bed in low position, maintain call bell within reach at all times, provide assistance with transfer out of bed and ambulation.  

## 2015-02-26 ENCOUNTER — Ambulatory Visit: Payer: No Typology Code available for payment source

## 2015-02-26 ENCOUNTER — Other Ambulatory Visit: Payer: No Typology Code available for payment source

## 2015-02-28 ENCOUNTER — Encounter: Payer: Self-pay | Admitting: *Deleted

## 2015-03-03 ENCOUNTER — Encounter: Admission: RE | Payer: Self-pay | Source: Ambulatory Visit

## 2015-03-03 ENCOUNTER — Ambulatory Visit
Admission: RE | Admit: 2015-03-03 | Payer: Medicare Other | Source: Ambulatory Visit | Admitting: Unknown Physician Specialty

## 2015-03-03 SURGERY — ESOPHAGOGASTRODUODENOSCOPY (EGD) WITH PROPOFOL
Anesthesia: General

## 2015-03-10 DIAGNOSIS — M545 Low back pain, unspecified: Secondary | ICD-10-CM | POA: Insufficient documentation

## 2015-03-11 ENCOUNTER — Ambulatory Visit: Payer: Medicare Other | Attending: Pain Medicine | Admitting: Pain Medicine

## 2015-03-11 ENCOUNTER — Encounter: Payer: Self-pay | Admitting: Pain Medicine

## 2015-03-11 VITALS — BP 138/71 | HR 69 | Temp 97.6°F | Resp 15 | Ht <= 58 in | Wt 186.0 lb

## 2015-03-11 DIAGNOSIS — M48062 Spinal stenosis, lumbar region with neurogenic claudication: Secondary | ICD-10-CM

## 2015-03-11 DIAGNOSIS — M179 Osteoarthritis of knee, unspecified: Secondary | ICD-10-CM | POA: Insufficient documentation

## 2015-03-11 DIAGNOSIS — M5126 Other intervertebral disc displacement, lumbar region: Secondary | ICD-10-CM | POA: Diagnosis not present

## 2015-03-11 DIAGNOSIS — M5136 Other intervertebral disc degeneration, lumbar region: Secondary | ICD-10-CM | POA: Diagnosis not present

## 2015-03-11 DIAGNOSIS — M533 Sacrococcygeal disorders, not elsewhere classified: Secondary | ICD-10-CM | POA: Insufficient documentation

## 2015-03-11 DIAGNOSIS — M4806 Spinal stenosis, lumbar region: Secondary | ICD-10-CM | POA: Diagnosis not present

## 2015-03-11 DIAGNOSIS — M5030 Other cervical disc degeneration, unspecified cervical region: Secondary | ICD-10-CM

## 2015-03-11 DIAGNOSIS — M545 Low back pain: Secondary | ICD-10-CM | POA: Diagnosis present

## 2015-03-11 DIAGNOSIS — M79606 Pain in leg, unspecified: Secondary | ICD-10-CM | POA: Diagnosis present

## 2015-03-11 DIAGNOSIS — M5481 Occipital neuralgia: Secondary | ICD-10-CM

## 2015-03-11 DIAGNOSIS — M170 Bilateral primary osteoarthritis of knee: Secondary | ICD-10-CM

## 2015-03-11 MED ORDER — METHOCARBAMOL 500 MG PO TABS: ORAL_TABLET | ORAL | Status: DC

## 2015-03-11 MED ORDER — METHADONE HCL 5 MG PO TABS: ORAL_TABLET | ORAL | Status: DC

## 2015-03-11 MED ORDER — HYDROCODONE-ACETAMINOPHEN 7.5-325 MG PO TABS: ORAL_TABLET | ORAL | Status: DC

## 2015-03-11 MED ORDER — MELOXICAM 7.5 MG PO TABS: ORAL_TABLET | ORAL | Status: DC

## 2015-03-11 NOTE — Progress Notes (Signed)
   Subjective:    Patient ID: Emily Kemp, female    DOB: 1940-03-03, 75 y.o.   MRN: [id number]  HPI  The patient is a 75 year old female who returns to pain management for further evaluation and treatment of pain involving the lower back and lower extremity region predominantly. The patient recently fell and traumatized the coccyx. The patient has pain across the lower back region radiating to the buttocks and was severe tenderness to palpation in the region of the coccyx. We discussed patient's condition and patient wished to proceed with interventional treatment at time return appointment to decrease severely disabling pain. The patient will continue medications as prescribed at this time. The patient is with understanding and agreed to suggested treatment plan.    Review of Systems     Objective:   Physical Exam There was tenderness to palpation of the splenius capitis and occipitalis musculature regions of mild degree with mild tenderness over the cervical facet cervical paraspinal musculatures and thoracic facet and thoracic paraspinal musculature region. No crepitus of the thoracic region was noted. There was tenderness over the acromioclavicular and glenohumeral joint regions and patient was with unremarkable Spurling's maneuver. Grip strength was decreased with Tinel and Phalen's maneuver reproducing minimal discomfort. There was tenderness to palpation of the medial and lateral epicondyles of the elbows of mild degree palpation over the lumbar paraspinal must reason lumbar facet region was with moderate tends to palpation was severe tenderness to palpation of the lower lumbar paraspinal musculatures and lumbar facet region and severe tenderness to palpation of the region of the coccyx. Palpation of the gluteal and piriformis musculature region reproduces moderate discomfort as well. Straight leg raising was limited to approximately 20 without a definite increased pain with dorsiflexion  noted. There was negative clonus negative Homans. Abdomen nontender with no costovertebral tenderness noted.       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 Please see your primary care physician today regarding your pulmonary condition especially  F/U surgical evaluation. May consider pending follow-up evaluations  F/U neurological evaluation. May consider pending follow-up evaluations  Use TENS unit as discussed and trying to perform mild exercises such as gentle range of motion, stretching and massage  May consider radiofrequency rhizolysis or intraspinal procedures pending response to present treatment and F/U evaluation   Patient to call Pain Management Center should patient have concerns prior to scheduled return appointment

## 2015-03-11 NOTE — Progress Notes (Signed)
Safety precautions to be maintained throughout the outpatient stay will include: orient to surroundings, keep bed in low position, maintain call bell within reach at all times, provide assistance with transfer out of bed and ambulation.  

## 2015-03-11 NOTE — Patient Instructions (Addendum)
PLAN   Continue present medication Robaxin hydrocodone acetaminophen and methadone and limit Mobic as discussed   Block of nerves to the sacroiliac joint and coccygeal nerve blocks to be performed at time of return appointment  F/U PCP Dr.Tejan-Sie  for evaliation of  BP and general medical  condition. Please see your primary care physician today regarding your pulmonary condition especially  F/U surgical evaluation. May consider pending follow-up evaluations  F/U neurological evaluation. May consider pending follow-up evaluations  Use TENS unit as discussed and trying to perform mild exercises such as gentle range of motion, stretching and massage  May consider radiofrequency rhizolysis or intraspinal procedures pending response to present treatment and F/U evaluation   Patient to call Pain Management Center should patient have concerns prior to scheduled return appointmentPain Management Discharge Instructions  General Discharge Instructions :  If you need to reach your doctor call: Monday-Friday 8:00 am - 4:00 pm at [phone] or toll free [phone].  After clinic hours [phone] to have operator reach doctor.  Bring all of your medication bottles to all your appointments in the pain clinic.  To cancel or reschedule your appointment with Pain Management please remember to call 24 hours in advance to avoid a fee.  Refer to the educational materials which you have been given on: General Risks, I had my Procedure. Discharge Instructions, Post Sedation.  Post Procedure Instructions:  The drugs you were given will stay in your system until tomorrow, so for the next 24 hours you should not drive, make any legal decisions or drink any alcoholic beverages.  You may eat anything you prefer, but it is better to start with liquids then soups and crackers, and gradually work up to solid foods.  Please notify your doctor immediately if you have any unusual bleeding, trouble  breathing or pain that is not related to your normal pain.  Depending on the type of procedure that was done, some parts of your body may feel week and/or numb.  This usually clears up by tonight or the next day.  Walk with the use of an assistive device or accompanied by an adult for the 24 hours.  You may use ice on the affected area for the first 24 hours.  Put ice in a Ziploc bag and cover with a towel and place against area 15 minutes on 15 minutes off.  You may switch to heat after 24 hours.Sacroiliac (SI) Joint Injection Patient Information  Description: The sacroiliac joint connects the scrum (very low back and tailbone) to the ilium (a pelvic bone which also forms half of the hip joint).  Normally this joint experiences very little motion.  When this joint becomes inflamed or unstable low back and or hip and pelvis pain may result.  Injection of this joint with local anesthetics (numbing medicines) and steroids can provide diagnostic information and reduce pain.  This injection is performed with the aid of x-ray guidance into the tailbone area while you are lying on your stomach.   You may experience an electrical sensation down the leg while this is being done.  You may also experience numbness.  We also may ask if we are reproducing your normal pain during the injection.  Conditions which may be treated SI injection:   Low back, buttock, hip or leg pain  Preparation for the Injection:  1. Do not eat any solid food or dairy products within 6 hours of your appointment.  2. You may drink clear liquids up to 2  hours before appointment.  Clear liquids include water, black coffee, juice or soda.  No milk or cream please. 3. You may take your regular medications, including pain medications with a sip of water before your appointment.  Diabetics should hold regular insulin (if take separately) and take 1/2 normal NPH dose the morning of the procedure.  Carry some sugar containing items with  you to your appointment. 4. A driver must accompany you and be prepared to drive you home after your procedure. 5. Bring all of your current medications with you. 6. An IV may be inserted and sedation may be given at the discretion of the physician. 7. A blood pressure cuff, EKG and other monitors will often be applied during the procedure.  Some patients may need to have extra oxygen administered for a short period.  8. You will be asked to provide medical information, including your allergies, prior to the procedure.  We must know immediately if you are taking blood thinners (like Coumadin/Warfarin) or if you are allergic to IV iodine contrast (dye).  We must know if you could possible be pregnant.  Possible side effects:   Bleeding from needle site  Infection (rare, may require surgery)  Nerve injury (rare)  Numbness & tingling (temporary)  A brief convulsion or seizure  Light-headedness (temporary)  Pain at injection site (several days)  Decreased blood pressure (temporary)  Weakness in the leg (temporary)   Call if you experience:   New onset weakness or numbness of an extremity below the injection site that last more than 8 hours.  Hives or difficulty breathing ( go to the emergency room)  Inflammation or drainage at the injection site  Any new symptoms which are concerning to you  Please note:  Although the local anesthetic injected can often make your back/ hip/ buttock/ leg feel good for several hours after the injections, the pain will likely return.  It takes 3-7 days for steroids to work in the sacroiliac area.  You may not notice any pain relief for at least that one week.  If effective, we will often do a series of three injections spaced 3-6 weeks apart to maximally decrease your pain.  After the initial series, we generally will wait some months before a repeat injection of the same type.  If you have any questions, please call [phone] [facility]

## 2015-03-19 ENCOUNTER — Ambulatory Visit: Payer: No Typology Code available for payment source | Admitting: Pain Medicine

## 2015-03-27 DIAGNOSIS — J449 Chronic obstructive pulmonary disease, unspecified: Secondary | ICD-10-CM | POA: Insufficient documentation

## 2015-04-10 ENCOUNTER — Encounter: Payer: Self-pay | Admitting: Pain Medicine

## 2015-04-10 ENCOUNTER — Ambulatory Visit: Payer: Medicare Other | Attending: Pain Medicine | Admitting: Pain Medicine

## 2015-04-10 VITALS — BP 111/50 | HR 56 | Temp 98.7°F | Resp 16 | Ht <= 58 in | Wt 186.0 lb

## 2015-04-10 DIAGNOSIS — M533 Sacrococcygeal disorders, not elsewhere classified: Secondary | ICD-10-CM | POA: Diagnosis not present

## 2015-04-10 DIAGNOSIS — M5136 Other intervertebral disc degeneration, lumbar region: Secondary | ICD-10-CM | POA: Diagnosis not present

## 2015-04-10 DIAGNOSIS — M5030 Other cervical disc degeneration, unspecified cervical region: Secondary | ICD-10-CM

## 2015-04-10 DIAGNOSIS — M545 Low back pain: Secondary | ICD-10-CM | POA: Diagnosis present

## 2015-04-10 DIAGNOSIS — M79606 Pain in leg, unspecified: Secondary | ICD-10-CM | POA: Diagnosis present

## 2015-04-10 DIAGNOSIS — M4806 Spinal stenosis, lumbar region: Secondary | ICD-10-CM | POA: Insufficient documentation

## 2015-04-10 DIAGNOSIS — M5126 Other intervertebral disc displacement, lumbar region: Secondary | ICD-10-CM | POA: Diagnosis not present

## 2015-04-10 DIAGNOSIS — M1710 Unilateral primary osteoarthritis, unspecified knee: Secondary | ICD-10-CM | POA: Insufficient documentation

## 2015-04-10 DIAGNOSIS — M51369 Other intervertebral disc degeneration, lumbar region without mention of lumbar back pain or lower extremity pain: Secondary | ICD-10-CM

## 2015-04-10 DIAGNOSIS — M48062 Spinal stenosis, lumbar region with neurogenic claudication: Secondary | ICD-10-CM

## 2015-04-10 DIAGNOSIS — M170 Bilateral primary osteoarthritis of knee: Secondary | ICD-10-CM

## 2015-04-10 DIAGNOSIS — M5481 Occipital neuralgia: Secondary | ICD-10-CM

## 2015-04-10 MED ORDER — METHADONE HCL 5 MG PO TABS: ORAL_TABLET | ORAL | Status: DC

## 2015-04-10 MED ORDER — METHOCARBAMOL 500 MG PO TABS: ORAL_TABLET | ORAL | Status: DC

## 2015-04-10 MED ORDER — HYDROCODONE-ACETAMINOPHEN 7.5-325 MG PO TABS: ORAL_TABLET | ORAL | Status: DC

## 2015-04-10 MED ORDER — PREGABALIN 150 MG PO CAPS: ORAL_CAPSULE | ORAL | Status: DC

## 2015-04-10 NOTE — Progress Notes (Signed)
Subjective:    Patient ID: Emily Kemp, female    DOB: 02-27-1940, 76 y.o.   MRN: [id number]  HPI The patient is a 75 year old female who returns to pain management for further evaluation and treatment of pain involving the lower back lower extremity region especially. The patient states that she has had return of severe pain involving the lower back lower extremity region as well as the region of the coccyx. The patient is with history of fall sustaining trauma to the coccyx. We discussed patient's condition on today's visit and patient states that she is in hopes of being able to undergo interventional treatment in attempt to decrease severity of symptoms, minimize progression of symptoms, and avoid the need for more involved treatment.. The patient continues medications without undesirable side effects. We will proceed with sacroiliac joint injection and block of nerves to the cocci at time of return appointment in attempt to decrease severity of symptoms, minimize progression of symptoms, and avoid the need for more involved treatment. The patient agreed to suggested treatment plan   Review of Systems     Objective:   Physical Exam  There was tenderness of the splenius capitis and occipitalis musculature region a mild degree with mild tenderness over the cervical facet cervical paraspinal musculature region and thoracic facet thoracic paraspinal musculature region the patient was at unremarkable Spurling's maneuver. No crepitus of the thoracic region was noted. Palpation of the acromioclavicular and glenohumeral joint regions reproduce mild discomfort. The patient appeared to be with bilaterally equal grip strength and Tinel and Phalen's maneuver were without increased pain of significant degree. There was tenderness to palpation of the medial and lateral epicondyles of the elbows of the left and right upper extremities. There was tenderness over the thoracic facet thoracic paraspinal  musculature region with no crepitus of the thoracic region noted. There was evidence of muscle spasms involving the thoracic paraspinal musculature region of moderately severe degree Palpation of the lumbar paraspinal musculature region lumbar facet region was with moderately severe tenderness to palpation was severe tenderness of the PSIS and PII S regions as well as the gluteal and piriformis musculature regions. There was moderate to moderately severe tenderness to palpation of the coccyx Leg raise was tolerates approximately 20 without an increase of pain with dorsiflexion noted. There was negative clonus negative Homans EHL strength appeared to be decreased No sensory deficit dermatomal distribution was detected. Abdomen was nontender with no costovertebral angle tenderness noted.      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 Continue Lyrica as prescribed  PLEASE NOTE LYRICA IS 150 mg SIZE    Sacroiliac joint injection to be performed at time of return appointment  F/U PCP Dr.Tejan-Sie  for evaliation of  BP and general medical    F/U surgical evaluation. May consider pending follow-up evaluations  F/U neurological evaluation. May consider pending follow-up evaluations  F/U psych evaluation as discussed  Use TENS unit as discussed and try to perform mild exercises such as gentle range of motion, stretching and massage  May consider radiofrequency rhizolysis or intraspinal procedures pending response to present  treatment and F/U evaluation We will avoid such procedures at this time  Patient  to call Pain Management Center should patient have concerns prior to scheduled return appointment

## 2015-04-10 NOTE — Progress Notes (Signed)
Safety precautions to be maintained throughout the outpatient stay will include: orient to surroundings, keep bed in low position, maintain call bell within reach at all times, provide assistance with transfer out of bed and ambulation.  

## 2015-04-10 NOTE — Patient Instructions (Addendum)
PLAN   Continue present medication Robaxin hydrocodone acetaminophen and methadone and limit Mobic as discussed . Continue Lyrica as prescribed  PLEASE NOTE LYRICA IS 150 mg SIZE    Sacroiliac joint injection to be performed at time of return appointment  F/U PCP Dr.Tejan-Sie  for evaliation of  BP and general medical    F/U surgical evaluation. May consider pending follow-up evaluations  F/U neurological evaluation. May consider pending follow-up evaluations  Use TENS unit as discussed and try to perform mild exercises such as gentle range of motion, stretching and massage  May consider radiofrequency rhizolysis or intraspinal procedures pending response to present treatment and F/U evaluation We will avoid such procedures at this time  Patient to call Pain Management Center should patient have concerns prior to scheduled return appointmentPain Management Discharge Instructions  General Discharge Instructions :  If you need to reach your doctor call: Monday-Friday 8:00 am - 4:00 pm at [phone] or toll free [phone].  After clinic hours [phone] to have operator reach doctor.  Bring all of your medication bottles to all your appointments in the pain clinic.  To cancel or reschedule your appointment with Pain Management please remember to call 24 hours in advance to avoid a fee.  Refer to the educational materials which you have been given on: General Risks, I had my Procedure. Discharge Instructions, Post Sedation.  Post Procedure Instructions:  The drugs you were given will stay in your system until tomorrow, so for the next 24 hours you should not drive, make any legal decisions or drink any alcoholic beverages.  You may eat anything you prefer, but it is better to start with liquids then soups and crackers, and gradually work up to solid foods.  Please notify your doctor immediately if you have any unusual bleeding, trouble breathing or pain that is not related to  your normal pain.  Depending on the type of procedure that was done, some parts of your body may feel week and/or numb.  This usually clears up by tonight or the next day.  Walk with the use of an assistive device or accompanied by an adult for the 24 hours.  You may use ice on the affected area for the first 24 hours.  Put ice in a Ziploc bag and cover with a towel and place against area 15 minutes on 15 minutes off.  You may switch to heat after 24 hours.Sacroiliac (SI) Joint Injection Patient Information  Description: The sacroiliac joint connects the scrum (very low back and tailbone) to the ilium (a pelvic bone which also forms half of the hip joint).  Normally this joint experiences very little motion.  When this joint becomes inflamed or unstable low back and or hip and pelvis pain may result.  Injection of this joint with local anesthetics (numbing medicines) and steroids can provide diagnostic information and reduce pain.  This injection is performed with the aid of x-ray guidance into the tailbone area while you are lying on your stomach.   You may experience an electrical sensation down the leg while this is being done.  You may also experience numbness.  We also may ask if we are reproducing your normal pain during the injection.  Conditions which may be treated SI injection:   Low back, buttock, hip or leg pain  Preparation for the Injection:  1. Do not eat any solid food or dairy products within 6 hours of your appointment.  2. You may drink clear liquids up to  2 hours before appointment.  Clear liquids include water, black coffee, juice or soda.  No milk or cream please. 3. You may take your regular medications, including pain medications with a sip of water before your appointment.  Diabetics should hold regular insulin (if take separately) and take 1/2 normal NPH dose the morning of the procedure.  Carry some sugar containing items with you to your appointment. 4. A driver must  accompany you and be prepared to drive you home after your procedure. 5. Bring all of your current medications with you. 6. An IV may be inserted and sedation may be given at the discretion of the physician. 7. A blood pressure cuff, EKG and other monitors will often be applied during the procedure.  Some patients may need to have extra oxygen administered for a short period.  8. You will be asked to provide medical information, including your allergies, prior to the procedure.  We must know immediately if you are taking blood thinners (like Coumadin/Warfarin) or if you are allergic to IV iodine contrast (dye).  We must know if you could possible be pregnant.  Possible side effects:   Bleeding from needle site  Infection (rare, may require surgery)  Nerve injury (rare)  Numbness & tingling (temporary)  A brief convulsion or seizure  Light-headedness (temporary)  Pain at injection site (several days)  Decreased blood pressure (temporary)  Weakness in the leg (temporary)   Call if you experience:   New onset weakness or numbness of an extremity below the injection site that last more than 8 hours.  Hives or difficulty breathing ( go to the emergency room)  Inflammation or drainage at the injection site  Any new symptoms which are concerning to you  Please note:  Although the local anesthetic injected can often make your back/ hip/ buttock/ leg feel good for several hours after the injections, the pain will likely return.  It takes 3-7 days for steroids to work in the sacroiliac area.  You may not notice any pain relief for at least that one week.  If effective, we will often do a series of three injections spaced 3-6 weeks apart to maximally decrease your pain.  After the initial series, we generally will wait some months before a repeat injection of the same type.  If you have any questions, please call [phone] [facility]  What are the risk, side effects and possible complications? Generally speaking, most procedures are safe.  However, with any procedure there are risks, side effects, and the possibility of complications.  The risks and complications are dependent upon the sites that are lesioned, or the type of nerve block to be performed.  The closer the procedure is to the spine, the more serious the risks are.  Great care is taken when placing the radio frequency needles, block needles or lesioning probes, but sometimes complications can occur. 1. Infection: Any time there is an injection through the skin, there is a risk of infection.  This is why sterile conditions are used for these blocks.  There are four possible types of infection. 1. Localized skin infection. 2. Central Nervous System Infection-This can be in the form of Meningitis, which can be deadly. 3. Epidural Infections-This can be in the form of an epidural abscess, which can cause pressure inside of the spine, causing compression of the spinal cord with subsequent paralysis. This would require an emergency surgery to decompress, and  there are no guarantees that the patient would recover from the paralysis. 4. Discitis-This is an infection of the intervertebral discs.  It occurs in about 1% of discography procedures.  It is difficult to treat and it may lead to surgery.        2. Pain: the needles have to go through skin and soft tissues, will cause soreness.       3. Damage to internal structures:  The nerves to be lesioned may be near blood vessels or    other nerves which can be potentially damaged.       4. Bleeding: Bleeding is more common if the patient is taking blood thinners such as  aspirin, Coumadin, Ticiid, Plavix, etc., or if he/she have some genetic predisposition  such as hemophilia. Bleeding into the spinal canal can cause compression of the spinal  cord with subsequent paralysis.  This would  require an emergency surgery to  decompress and there are no guarantees that the patient would recover from the  paralysis.       5. Pneumothorax:  Puncturing of a lung is a possibility, every time a needle is introduced in  the area of the chest or upper back.  Pneumothorax refers to free air around the  collapsed lung(s), inside of the thoracic cavity (chest cavity).  Another two possible  complications related to a similar event would include: Hemothorax and Chylothorax.   These are variations of the Pneumothorax, where instead of air around the collapsed  lung(s), you may have blood or chyle, respectively.       6. Spinal headaches: They may occur with any procedures in the area of the spine.       7. Persistent CSF (Cerebro-Spinal Fluid) leakage: This is a rare problem, but may occur  with prolonged intrathecal or epidural catheters either due to the formation of a fistulous  track or a dural tear.       8. Nerve damage: By working so close to the spinal cord, there is always a possibility of  nerve damage, which could be as serious as a permanent spinal cord injury with  paralysis.       9. Death:  Although rare, severe deadly allergic reactions known as "Anaphylactic  reaction" can occur to any of the medications used.      10. Worsening of the symptoms:  We can always make thing worse.  What are the chances of something like this happening? Chances of any of this occuring are extremely low.  By statistics, you have more of a chance of getting killed in a motor vehicle accident: while driving to the hospital than any of the above occurring .  Nevertheless, you should be aware that they are possibilities.  In general, it is similar to taking a shower.  Everybody knows that you can slip, hit your head and get killed.  Does that mean that you should not shower again?  Nevertheless always keep in mind that statistics do not mean anything if you happen to be on the wrong side of them.  Even if a procedure  has a 1 (one) in a 1,000,000 (million) chance of going wrong, it you happen to be that one..Also, keep in mind that by statistics, you have more of a chance of having something go wrong when taking medications.  Who should not have this procedure? If you are on a blood thinning medication (e.g. Coumadin, Plavix, see list of "Blood Thinners"), or if you have an  active infection going on, you should not have the procedure.  If you are taking any blood thinners, please inform your physician.  How should I prepare for this procedure?  Do not eat or drink anything at least six hours prior to the procedure.  Bring a driver with you .  It cannot be a taxi.  Come accompanied by an adult that can drive you back, and that is strong enough to help you if your legs get weak or numb from the local anesthetic.  Take all of your medicines the morning of the procedure with just enough water to swallow them.  If you have diabetes, make sure that you are scheduled to have your procedure done first thing in the morning, whenever possible.  If you have diabetes, take only half of your insulin dose and notify our nurse that you have done so as soon as you arrive at the clinic.  If you are diabetic, but only take blood sugar pills (oral hypoglycemic), then do not take them on the morning of your procedure.  You may take them after you have had the procedure.  Do not take aspirin or any aspirin-containing medications, at least eleven (11) days prior to the procedure.  They may prolong bleeding.  Wear loose fitting clothing that may be easy to take off and that you would not mind if it got stained with Betadine or blood.  Do not wear any jewelry or perfume  Remove any nail coloring.  It will interfere with some of our monitoring equipment.  NOTE: Remember that this is not meant to be interpreted as a complete list of all possible complications.  Unforeseen problems may occur.  BLOOD THINNERS The following  drugs contain aspirin or other products, which can cause increased bleeding during surgery and should not be taken for 2 weeks prior to and 1 week after surgery.  If you should need take something for relief of minor pain, you may take acetaminophen which is found in Tylenol,m Datril, Anacin-3 and Panadol. It is not blood thinner. The products listed below are.  Do not take any of the products listed below in addition to any listed on your instruction sheet.  A.P.C or A.P.C with Codeine Codeine Phosphate Capsules #3 Ibuprofen Ridaura  ABC compound Congesprin Imuran rimadil  Advil Cope Indocin Robaxisal  Alka-Seltzer Effervescent Pain Reliever and Antacid Coricidin or Coricidin-D  Indomethacin Rufen  Alka-Seltzer plus Cold Medicine Cosprin Ketoprofen S-A-C Tablets  Anacin Analgesic Tablets or Capsules Coumadin Korlgesic Salflex  Anacin Extra Strength Analgesic tablets or capsules CP-2 Tablets Lanoril Salicylate  Anaprox Cuprimine Capsules Levenox Salocol  Anexsia-D Dalteparin Magan Salsalate  Anodynos Darvon compound Magnesium Salicylate Sine-off  Ansaid Dasin Capsules Magsal Sodium Salicylate  Anturane Depen Capsules Marnal Soma  APF Arthritis pain formula Dewitt's Pills Measurin Stanback  Argesic Dia-Gesic Meclofenamic Sulfinpyrazone  Arthritis Bayer Timed Release Aspirin Diclofenac Meclomen Sulindac  Arthritis pain formula Anacin Dicumarol Medipren Supac  Analgesic (Safety coated) Arthralgen Diffunasal Mefanamic Suprofen  Arthritis Strength Bufferin Dihydrocodeine Mepro Compound Suprol  Arthropan liquid Dopirydamole Methcarbomol with Aspirin Synalgos  ASA tablets/Enseals Disalcid Micrainin Tagament  Ascriptin Doan's Midol Talwin  Ascriptin A/D Dolene Mobidin Tanderil  Ascriptin Extra Strength Dolobid Moblgesic Ticlid  Ascriptin with Codeine Doloprin or Doloprin with Codeine Momentum Tolectin  Asperbuf Duoprin Mono-gesic Trendar  Aspergum Duradyne Motrin or Motrin IB Triminicin  Aspirin  plain, buffered or enteric coated Durasal Myochrisine Trigesic  Aspirin Suppositories Easprin Nalfon Trillsate  Aspirin with Codeine Ecotrin Regular  or Extra Strength Naprosyn Uracel  Atromid-S Efficin Naproxen Ursinus  Auranofin Capsules Elmiron Neocylate Vanquish  Axotal Emagrin Norgesic Verin  Azathioprine Empirin or Empirin with Codeine Normiflo Vitamin E  Azolid Emprazil Nuprin Voltaren  Bayer Aspirin plain, buffered or children's or timed BC Tablets or powders Encaprin Orgaran Warfarin Sodium  Buff-a-Comp Enoxaparin Orudis Zorpin  Buff-a-Comp with Codeine Equegesic Os-Cal-Gesic   Buffaprin Excedrin plain, buffered or Extra Strength Oxalid   Bufferin Arthritis Strength Feldene Oxphenbutazone   Bufferin plain or Extra Strength Feldene Capsules Oxycodone with Aspirin   Bufferin with Codeine Fenoprofen Fenoprofen Pabalate or Pabalate-SF   Buffets II Flogesic Panagesic   Buffinol plain or Extra Strength Florinal or Florinal with Codeine Panwarfarin   Buf-Tabs Flurbiprofen Penicillamine   Butalbital Compound Four-way cold tablets Penicillin   Butazolidin Fragmin Pepto-Bismol   Carbenicillin Geminisyn Percodan   Carna Arthritis Reliever Geopen Persantine   Carprofen Gold's salt Persistin   Chloramphenicol Goody's Phenylbutazone   Chloromycetin Haltrain Piroxlcam   Clmetidine heparin Plaquenil   Cllnoril Hyco-pap Ponstel   Clofibrate Hydroxy chloroquine Propoxyphen         Before stopping any of these medications, be sure to consult the physician who ordered them.  Some, such as Coumadin (Warfarin) are ordered to prevent or treat serious conditions such as "deep thrombosis", "pumonary embolisms", and other heart problems.  The amount of time that you may need off of the medication may also vary with the medication and the reason for which you were taking it.  If you are taking any of these medications, please make sure you notify your pain physician before you undergo any  procedures.

## 2015-04-16 ENCOUNTER — Ambulatory Visit: Payer: No Typology Code available for payment source | Admitting: Dietician

## 2015-04-16 ENCOUNTER — Ambulatory Visit: Payer: Medicare Other | Attending: Pain Medicine | Admitting: Pain Medicine

## 2015-04-16 ENCOUNTER — Encounter: Payer: Self-pay | Admitting: Pain Medicine

## 2015-04-16 VITALS — BP 122/70 | HR 60 | Temp 95.6°F | Resp 14 | Ht <= 58 in | Wt 186.0 lb

## 2015-04-16 DIAGNOSIS — M5126 Other intervertebral disc displacement, lumbar region: Secondary | ICD-10-CM | POA: Insufficient documentation

## 2015-04-16 DIAGNOSIS — M545 Low back pain: Secondary | ICD-10-CM | POA: Diagnosis present

## 2015-04-16 DIAGNOSIS — M533 Sacrococcygeal disorders, not elsewhere classified: Secondary | ICD-10-CM

## 2015-04-16 DIAGNOSIS — M79606 Pain in leg, unspecified: Secondary | ICD-10-CM | POA: Diagnosis present

## 2015-04-16 DIAGNOSIS — M170 Bilateral primary osteoarthritis of knee: Secondary | ICD-10-CM

## 2015-04-16 DIAGNOSIS — M48062 Spinal stenosis, lumbar region with neurogenic claudication: Secondary | ICD-10-CM

## 2015-04-16 DIAGNOSIS — M51369 Other intervertebral disc degeneration, lumbar region without mention of lumbar back pain or lower extremity pain: Secondary | ICD-10-CM

## 2015-04-16 DIAGNOSIS — M5030 Other cervical disc degeneration, unspecified cervical region: Secondary | ICD-10-CM

## 2015-04-16 DIAGNOSIS — M5136 Other intervertebral disc degeneration, lumbar region: Secondary | ICD-10-CM | POA: Insufficient documentation

## 2015-04-16 DIAGNOSIS — M5481 Occipital neuralgia: Secondary | ICD-10-CM

## 2015-04-16 MED ORDER — ORPHENADRINE CITRATE 30 MG/ML IJ SOLN
INTRAMUSCULAR | Status: AC
  Administered 2015-04-16: 08:00:00
  Filled 2015-04-16: qty 2

## 2015-04-16 MED ORDER — MIDAZOLAM HCL 5 MG/5ML IJ SOLN
INTRAMUSCULAR | Status: AC
  Administered 2015-04-16: 1 mg via INTRAVENOUS
  Filled 2015-04-16: qty 5

## 2015-04-16 MED ORDER — BUPIVACAINE HCL (PF) 0.25 % IJ SOLN: 30.0000 mL | Freq: Once | INTRAMUSCULAR | Status: DC

## 2015-04-16 MED ORDER — CEFAZOLIN SODIUM 1-5 GM-% IV SOLN: 1.0000 g | Freq: Once | INTRAVENOUS | Status: DC

## 2015-04-16 MED ORDER — BUPIVACAINE HCL (PF) 0.25 % IJ SOLN
INTRAMUSCULAR | Status: AC
  Administered 2015-04-16: 08:00:00
  Filled 2015-04-16: qty 30

## 2015-04-16 MED ORDER — MIDAZOLAM HCL 5 MG/5ML IJ SOLN: 5.0000 mg | Freq: Once | INTRAMUSCULAR | Status: DC

## 2015-04-16 MED ORDER — TRIAMCINOLONE ACETONIDE 40 MG/ML IJ SUSP: 40.0000 mg | Freq: Once | INTRAMUSCULAR | Status: DC

## 2015-04-16 MED ORDER — FENTANYL CITRATE (PF) 100 MCG/2ML IJ SOLN
INTRAMUSCULAR | Status: AC
  Administered 2015-04-16: 50 ug via INTRAVENOUS
  Filled 2015-04-16: qty 2

## 2015-04-16 MED ORDER — LACTATED RINGERS IV SOLN: 1000.0000 mL | INTRAVENOUS | Status: DC

## 2015-04-16 MED ORDER — FENTANYL CITRATE (PF) 100 MCG/2ML IJ SOLN: 100.0000 ug | Freq: Once | INTRAMUSCULAR | Status: DC

## 2015-04-16 MED ORDER — ORPHENADRINE CITRATE 30 MG/ML IJ SOLN: 60.0000 mg | Freq: Once | INTRAMUSCULAR | Status: DC

## 2015-04-16 MED ORDER — CEFAZOLIN SODIUM 1 G IJ SOLR
INTRAMUSCULAR | Status: AC
  Administered 2015-04-16: 1 g via INTRAVENOUS
  Filled 2015-04-16: qty 10

## 2015-04-16 MED ORDER — TRIAMCINOLONE ACETONIDE 40 MG/ML IJ SUSP
INTRAMUSCULAR | Status: AC
  Administered 2015-04-16: 08:00:00
  Filled 2015-04-16: qty 1

## 2015-04-16 MED ORDER — CEFUROXIME AXETIL 250 MG PO TABS: 250.0000 mg | ORAL_TABLET | Freq: Two times a day (BID) | ORAL | Status: DC

## 2015-04-16 NOTE — Patient Instructions (Addendum)
PLAN   Continue present medication Robaxin hydrocodone acetaminophen and methadone and limit Mobic as discussed . Continue Lyrica as prescribed  PLEASE NOTE LYRICA IS 150 mg SIZE . Please get Ceftin antibiotic today and begin taking Ceftin antibiotic as prescribe  F/U PCP Dr.Tejan-Sie  for evaliation of  BP and general medical    F/U surgical evaluation. May consider pending follow-up evaluations  F/U neurological evaluation. May consider pending follow-up evaluations  Use TENS unit as discussed and try to perform mild exercises such as gentle range of motion, stretching and massage  May consider radiofrequency rhizolysis or intraspinal procedures pending response to present treatment and F/U evaluation We will avoid such procedures at this time  Patient to call [facility] Center should patient have concerns prior to scheduled return appointmentGENERAL RISKS AND COMPLICATIONS  What are the risk, side effects and possible complications? Generally speaking, most procedures are safe.  However, with any procedure there are risks, side effects, and the possibility of complications.  The risks and complications are dependent upon the sites that are lesioned, or the type of nerve block to be performed.  The closer the procedure is to the spine, the more serious the risks are.  Great care is taken when placing the radio frequency needles, block needles or lesioning probes, but sometimes complications can occur. 1. Infection: Any time there is an injection through the skin, there is a risk of infection.  This is why sterile conditions are used for these blocks.  There are four possible types of infection. 1. Localized skin infection. 2. Central Nervous System Infection-This can be in the form of Meningitis, which can be deadly. 3. Epidural Infections-This can be in the form of an epidural abscess, which can cause pressure inside of the spine, causing compression of the spinal cord with subsequent  paralysis. This would require an emergency surgery to decompress, and there are no guarantees that the patient would recover from the paralysis. 4. Discitis-This is an infection of the intervertebral discs.  It occurs in about 1% of discography procedures.  It is difficult to treat and it may lead to surgery.        2. Pain: the needles have to go through skin and soft tissues, will cause soreness.       3. Damage to internal structures:  The nerves to be lesioned may be near blood vessels or    other nerves which can be potentially damaged.       4. Bleeding: Bleeding is more common if the patient is taking blood thinners such as  aspirin, Coumadin, Ticiid, Plavix, etc., or if he/she have some genetic predisposition  such as hemophilia. Bleeding into the spinal canal can cause compression of the spinal  cord with subsequent paralysis.  This would require an emergency surgery to  decompress and there are no guarantees that the patient would recover from the  paralysis.       5. Pneumothorax:  Puncturing of a lung is a possibility, every time a needle is introduced in  the area of the chest or upper back.  Pneumothorax refers to free air around the  collapsed lung(s), inside of the thoracic cavity (chest cavity).  Another two possible  complications related to a similar event would include: Hemothorax and Chylothorax.   These are variations of the Pneumothorax, where instead of air around the collapsed  lung(s), you may have blood or chyle, respectively.       6. Spinal headaches: They may occur  with any procedures in the area of the spine.       7. Persistent CSF (Cerebro-Spinal Fluid) leakage: This is a rare problem, but may occur  with prolonged intrathecal or epidural catheters either due to the formation of a fistulous  track or a dural tear.       8. Nerve damage: By working so close to the spinal cord, there is always a possibility of  nerve damage, which could be as serious as a permanent spinal  cord injury with  paralysis.       9. Death:  Although rare, severe deadly allergic reactions known as "Anaphylactic  reaction" can occur to any of the medications used.      10. Worsening of the symptoms:  We can always make thing worse.  What are the chances of something like this happening? Chances of any of this occuring are extremely low.  By statistics, you have more of a chance of getting killed in a motor vehicle accident: while driving to the hospital than any of the above occurring .  Nevertheless, you should be aware that they are possibilities.  In general, it is similar to taking a shower.  Everybody knows that you can slip, hit your head and get killed.  Does that mean that you should not shower again?  Nevertheless always keep in mind that statistics do not mean anything if you happen to be on the wrong side of them.  Even if a procedure has a 1 (one) in a 1,000,000 (million) chance of going wrong, it you happen to be that one..Also, keep in mind that by statistics, you have more of a chance of having something go wrong when taking medications.  Who should not have this procedure? If you are on a blood thinning medication (e.g. Coumadin, Plavix, see list of "Blood Thinners"), or if you have an active infection going on, you should not have the procedure.  If you are taking any blood thinners, please inform your physician.  How should I prepare for this procedure?  Do not eat or drink anything at least six hours prior to the procedure.  Bring a driver with you .  It cannot be a taxi.  Come accompanied by an adult that can drive you back, and that is strong enough to help you if your legs get weak or numb from the local anesthetic.  Take all of your medicines the morning of the procedure with just enough water to swallow them.  If you have diabetes, make sure that you are scheduled to have your procedure done first thing in the morning, whenever possible.  If you have diabetes,  take only half of your insulin dose and notify our nurse that you have done so as soon as you arrive at the clinic.  If you are diabetic, but only take blood sugar pills (oral hypoglycemic), then do not take them on the morning of your procedure.  You may take them after you have had the procedure.  Do not take aspirin or any aspirin-containing medications, at least eleven (11) days prior to the procedure.  They may prolong bleeding.  Wear loose fitting clothing that may be easy to take off and that you would not mind if it got stained with Betadine or blood.  Do not wear any jewelry or perfume  Remove any nail coloring.  It will interfere with some of our monitoring equipment.  NOTE: Remember that this is not meant to be interpreted as  a complete list of all possible complications.  Unforeseen problems may occur.  BLOOD THINNERS The following drugs contain aspirin or other products, which can cause increased bleeding during surgery and should not be taken for 2 weeks prior to and 1 week after surgery.  If you should need take something for relief of minor pain, you may take acetaminophen which is found in Tylenol,m Datril, Anacin-3 and Panadol. It is not blood thinner. The products listed below are.  Do not take any of the products listed below in addition to any listed on your instruction sheet.  A.P.C or A.P.C with Codeine Codeine Phosphate Capsules #3 Ibuprofen Ridaura  ABC compound Congesprin Imuran rimadil  Advil Cope Indocin Robaxisal  Alka-Seltzer Effervescent Pain Reliever and Antacid Coricidin or Coricidin-D  Indomethacin Rufen  Alka-Seltzer plus Cold Medicine Cosprin Ketoprofen S-A-C Tablets  Anacin Analgesic Tablets or Capsules Coumadin Korlgesic Salflex  Anacin Extra Strength Analgesic tablets or capsules CP-2 Tablets Lanoril Salicylate  Anaprox Cuprimine Capsules Levenox Salocol  Anexsia-D Dalteparin Magan Salsalate  Anodynos Darvon compound Magnesium Salicylate Sine-off   Ansaid Dasin Capsules Magsal Sodium Salicylate  Anturane Depen Capsules Marnal Soma  APF Arthritis pain formula Dewitt's Pills Measurin Stanback  Argesic Dia-Gesic Meclofenamic Sulfinpyrazone  Arthritis Bayer Timed Release Aspirin Diclofenac Meclomen Sulindac  Arthritis pain formula Anacin Dicumarol Medipren Supac  Analgesic (Safety coated) Arthralgen Diffunasal Mefanamic Suprofen  Arthritis Strength Bufferin Dihydrocodeine Mepro Compound Suprol  Arthropan liquid Dopirydamole Methcarbomol with Aspirin Synalgos  ASA tablets/Enseals Disalcid Micrainin Tagament  Ascriptin Doan's Midol Talwin  Ascriptin A/D Dolene Mobidin Tanderil  Ascriptin Extra Strength Dolobid Moblgesic Ticlid  Ascriptin with Codeine Doloprin or Doloprin with Codeine Momentum Tolectin  Asperbuf Duoprin Mono-gesic Trendar  Aspergum Duradyne Motrin or Motrin IB Triminicin  Aspirin plain, buffered or enteric coated Durasal Myochrisine Trigesic  Aspirin Suppositories Easprin Nalfon Trillsate  Aspirin with Codeine Ecotrin Regular or Extra Strength Naprosyn Uracel  Atromid-S Efficin Naproxen Ursinus  Auranofin Capsules Elmiron Neocylate Vanquish  Axotal Emagrin Norgesic Verin  Azathioprine Empirin or Empirin with Codeine Normiflo Vitamin E  Azolid Emprazil Nuprin Voltaren  Bayer Aspirin plain, buffered or children's or timed BC Tablets or powders Encaprin Orgaran Warfarin Sodium  Buff-a-Comp Enoxaparin Orudis Zorpin  Buff-a-Comp with Codeine Equegesic Os-Cal-Gesic   Buffaprin Excedrin plain, buffered or Extra Strength Oxalid   Bufferin Arthritis Strength Feldene Oxphenbutazone   Bufferin plain or Extra Strength Feldene Capsules Oxycodone with Aspirin   Bufferin with Codeine Fenoprofen Fenoprofen Pabalate or Pabalate-SF   Buffets II Flogesic Panagesic   Buffinol plain or Extra Strength Florinal or Florinal with Codeine Panwarfarin   Buf-Tabs Flurbiprofen Penicillamine   Butalbital Compound Four-way cold tablets  Penicillin   Butazolidin Fragmin Pepto-Bismol   Carbenicillin Geminisyn Percodan   Carna Arthritis Reliever Geopen Persantine   Carprofen Gold's salt Persistin   Chloramphenicol Goody's Phenylbutazone   Chloromycetin Haltrain Piroxlcam   Clmetidine heparin Plaquenil   Cllnoril Hyco-pap Ponstel   Clofibrate Hydroxy chloroquine Propoxyphen         Before stopping any of these medications, be sure to consult the physician who ordered them.  Some, such as Coumadin (Warfarin) are ordered to prevent or treat serious conditions such as "deep thrombosis", "pumonary embolisms", and other heart problems.  The amount of time that you may need off of the medication may also vary with the medication and the reason for which you were taking it.  If you are taking any of these medications, please make sure you notify your pain physician before  you undergo any procedures.

## 2015-04-16 NOTE — Progress Notes (Signed)
Safety precautions to be maintained throughout the outpatient stay will include: orient to surroundings, keep bed in low position, maintain call bell within reach at all times, provide assistance with transfer out of bed and ambulation.  

## 2015-04-16 NOTE — Progress Notes (Signed)
Subjective:    Patient ID: Emily Kemp, female    DOB: Jun 25, 1940, 75 y.o.   MRN: [id number]  HPI  NOTE:  The patient is a 75 y.o. female who returns to the [facility] for further evaluation and treatment of pain involving the lumbar and lower extremity region with pain involving the region of the hip and buttocks as well.  Prior studies reveal patient to be with evidence of MRI evidence of degenerative disc disease lumbar spine L3-4, L4-L5, and L5-S1 with central disc bulge and impression upon the central thecal sac L4-L5 broad-based disc bulge and annular tear L5-S1. The patient is with reproduction of severe pain with palpation over the PSIS and PII S regions and is with positive Patrick's maneuver.   There is concern regarding patient's pain being due to significant component of sacroiliac joint dysfunction. We have discussed patient's condition.  The risks, benefits, and expectations of the procedure have been discussed and explained to the patient who is understanding and wishes to proceed with interventional treatment as planned.    PROCEDURE: Sacroiliac joint on the left side injection with IV Versed, IV Fentanyl conscious sedation, EKG, blood pressure, pulse, pulse oximetry monitoring.  Procedure was performed with patient in prone position under fluoroscopic guidance.    Inferior pole sacroiliac joint injection on the left side.  With patient in prone position and Betadine prep of proposed entry site of accomplished, 22 -gauge needle was inserted at the inferior pole of the sacroiliac joint on the left under fluoroscopic guidance.  Following documentation of needle placement at the inferior pole of the sacroiliac joint on the left, a total of 1cc 0.25% bupivacaine with kenalog was injected for left inferior pole sacroiliac joint injection.  Needle was removed.    Superior pole sacroiliac joint injection on the left side.  With patient in prone position and Betadine prep of  proposed entry site of accomplished, 22-gauge needle was inserted at the superior pole of the sacroiliac joint on the left under fluoroscopic guidance.  Following documentation of needle placement at the inferior pole of the sacroiliac joint on the left, a total of 1cc 0.25% bupivacaine with kenalog was injected for left superior pole sacroiliac joint injection.  Needle was removed.   SACROILIAC JOINT INJECTION ON THE RIGHT SIDE  The procedure was performed on the right side exactly as was performed on the left side utilizing the same technique and under fluoroscopic guidance for both superior pole sacroiliac joint injections and inferior pole sacroiliac joint injections on the right side  Myoneural block injection of the gluteal musculature region Following Betadine prep of proposed entry site a 22-gauge needle was inserted in the gluteal musculature region and following negative aspiration 2 cc of 0.25% bupivacaine with Norflex was injected for myoneural block injection of the gluteal musculature region 4.     Patient tolerated procedure well.  A total of 10 mg Kenalog was utilized for the entire procedure.  PLAN:    1. Medications: Will continue presently prescribed medications. Robaxin , Lyrica, hydrocodone acetaminophen, and methadone 2. Patient to follow up with primary care physician Dr. Elijio Miles for evaluation of blood pressure and general medical condition status post sacroiliac joint injection performed on today's visit. 3. Surgical evaluation as discussed.. Patient prefers to avoid surgical intervention at this time  4. Neurological evaluation with PNCV/EMG studies as discussed. 5. Patient may be candidate for radiofrequency procedures, Botox injections, implantation-type procedures, and other treatment pending response to treatment and follow-up  evaluation. 6. Patient has been advised to adhere to proper body mechanics and to call Pain Management Center prior to scheduled return  appointment should there be significant change in condition or patient have other concerns regarding condition prior to scheduled return appointment.    Patient with understanding and in agreement with suggested treatment plan.          Review of Systems     Objective:   Physical Exam        Assessment & Plan:

## 2015-04-17 ENCOUNTER — Telehealth: Payer: Self-pay | Admitting: *Deleted

## 2015-04-17 NOTE — Telephone Encounter (Signed)
Spoke with patients husband and he states that she is doing well.

## 2015-04-25 ENCOUNTER — Other Ambulatory Visit: Payer: Self-pay | Admitting: Pain Medicine

## 2015-05-08 ENCOUNTER — Encounter: Payer: Self-pay | Admitting: Pain Medicine

## 2015-05-08 ENCOUNTER — Ambulatory Visit: Payer: Medicare Other | Attending: Pain Medicine | Admitting: Pain Medicine

## 2015-05-08 VITALS — BP 132/63 | HR 67 | Temp 98.3°F | Resp 16 | Ht <= 58 in | Wt 186.0 lb

## 2015-05-08 DIAGNOSIS — M5126 Other intervertebral disc displacement, lumbar region: Secondary | ICD-10-CM | POA: Insufficient documentation

## 2015-05-08 DIAGNOSIS — M5030 Other cervical disc degeneration, unspecified cervical region: Secondary | ICD-10-CM

## 2015-05-08 DIAGNOSIS — M533 Sacrococcygeal disorders, not elsewhere classified: Secondary | ICD-10-CM

## 2015-05-08 DIAGNOSIS — M5481 Occipital neuralgia: Secondary | ICD-10-CM

## 2015-05-08 DIAGNOSIS — M4806 Spinal stenosis, lumbar region: Secondary | ICD-10-CM | POA: Diagnosis not present

## 2015-05-08 DIAGNOSIS — M170 Bilateral primary osteoarthritis of knee: Secondary | ICD-10-CM | POA: Insufficient documentation

## 2015-05-08 DIAGNOSIS — M48062 Spinal stenosis, lumbar region with neurogenic claudication: Secondary | ICD-10-CM

## 2015-05-08 DIAGNOSIS — M542 Cervicalgia: Secondary | ICD-10-CM | POA: Diagnosis present

## 2015-05-08 DIAGNOSIS — M546 Pain in thoracic spine: Secondary | ICD-10-CM | POA: Diagnosis present

## 2015-05-08 DIAGNOSIS — M5136 Other intervertebral disc degeneration, lumbar region: Secondary | ICD-10-CM | POA: Insufficient documentation

## 2015-05-08 DIAGNOSIS — R51 Headache: Secondary | ICD-10-CM | POA: Diagnosis present

## 2015-05-08 MED ORDER — METHADONE HCL 5 MG PO TABS: ORAL_TABLET | ORAL | Status: DC

## 2015-05-08 MED ORDER — MELOXICAM 7.5 MG PO TABS: ORAL_TABLET | ORAL | Status: DC

## 2015-05-08 MED ORDER — METHOCARBAMOL 500 MG PO TABS: ORAL_TABLET | ORAL | Status: DC

## 2015-05-08 MED ORDER — PREGABALIN 150 MG PO CAPS: ORAL_CAPSULE | ORAL | Status: DC

## 2015-05-08 MED ORDER — HYDROCODONE-ACETAMINOPHEN 7.5-325 MG PO TABS: ORAL_TABLET | ORAL | Status: DC

## 2015-05-08 NOTE — Progress Notes (Signed)
Safety precautions to be maintained throughout the outpatient stay will include: orient to surroundings, keep bed in low position, maintain call bell within reach at all times, provide assistance with transfer out of bed and ambulation.  

## 2015-05-08 NOTE — Progress Notes (Signed)
Subjective:    Patient ID: Emily Kemp, female    DOB: 29-Apr-1940, 75 y.o.   MRN: [id number]  HPI  The patient is a 75 year old female who returns to pain management for further evaluation and treatment of pain involving the neck entire back upper and lower extremity regions as well as headaches. The patient is had significant improvement of her lower back and lower extremity pain following previous sacroiliac joint injection with myoneural block injections. The patient recently fell when she was trying to get out of her recliner to pick up a great. The patient sustained trauma to the left upper extremity especially. We discussed patient's condition and patient states that she continues to have significant pain involving the knees. The patient states that her lower back lower extremity pain has improved significantly following procedure We discussed patient's condition and will proceed with geniculate nerve blocks of knee at time return appointment. The patient is without desire to consider surgical intervention of the knees. We will continue presently prescribed medications and will proceed with geniculate nerve blocks of knee at time return appointment as discussed. All agreed to suggested treatment plan     Review of Systems     Objective:   Physical Exam   There was tenderness of the splenius capitis and occipitalis musculature regions of mild degree with mild tenderness of the cervical facet cervical paraspinal musculature region. Palpation of the acromioclavicular and glenohumeral joint region was with mild discomfort as well. Palpation over the cervical facet cervical paraspinal musculature regions reproduce mild discomfort. The patient was with unremarkable Spurling's maneuver. Palpation of the acromioclavicular and glenohumeral joint regions reproduce mild to moderate discomfort on the left compared to the right the left elbow was with lesion in the region of the olecranon without  drainage. There was ecchymosis of the left upper extremity of the left upper extremity in the region of the left elbow There was no significant increase of pain with Tinel and Phalen's maneuver. The patient was a slightly decreased grip strength. Palpation over the thoracic region thoracic facet region was without crepitus of the thoracic region. There was evidence of mild to moderate muscle spasm of the thoracic region noted. Palpation over the lumbar paraspinal must reason lumbar facet region was attends to palpation of mild to moderate degree. Lateral bending rotation extension and palpation of the lumbar facets reproduce mild to moderate discomfort. Palpation over the PSIS and PII S region was attends to palpation of mild to moderate degree. Straight leg raise was tolerates approximately 30 without increased pain with dorsiflexion noted. EHL strength was decreased. No definite sensory deficit or dermatomal distribution detected. There was tends to palpation of the knees. There was negative anterior and posterior drawer signs without ballottement of the patella. Range of motion of the knee reproduces severe pain with crepitus of the knees. No increased warmth erythema of the knees noted. There was negative clonus negative Homans and no costovertebral angle tenderness or abdominal tenderness to palpation noted. 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 Continue Lyrica as prescribed As previously mentioned PLEASE NOTE LYRICA IS 150 mg SIZE     Geniculate nerve blocks of the need to be performed at time of return appointment  F/U PCP Dr.Tejan-Sie  for evaliation of  BP and general medical    F/U surgical evaluation. May consider pending follow-up evaluations  F/U neurological evaluation. May consider pending follow-up evaluations  Use TENS unit as discussed and try to perform mild exercises such as gentle range of motion, stretching and massage  May consider radiofrequency rhizolysis or intraspinal procedures pending response to present treatment and F/U evaluation We will avoid such procedures at this time  Patient to call Pain Management Center should patient have concerns prior to scheduled return appointment

## 2015-05-08 NOTE — Patient Instructions (Addendum)
PLAN   Continue present medication Robaxin hydrocodone acetaminophen and methadone and limit Mobic as discussed . Continue Lyrica as prescribed As previously mentioned PLEASE NOTE LYRICA IS 150 mg SIZE    Geniculate nerve blocks of the need to be performed at time of return appointment  F/U PCP Dr.Tejan-Sie  for evaliation of  BP and general medical    F/U surgical evaluation. May consider pending follow-up evaluations  F/U neurological evaluation. May consider pending follow-up evaluations  Use TENS unit as discussed and try to perform mild exercises such as gentle range of motion, stretching and massage  May consider radiofrequency rhizolysis or intraspinal procedures pending response to present treatment and F/U evaluation We will avoid such procedures at this time  Patient to call Pain Management Center should patient have concerns prior to scheduled return appointment

## 2015-05-12 ENCOUNTER — Ambulatory Visit: Payer: No Typology Code available for payment source | Admitting: Dietician

## 2015-05-21 ENCOUNTER — Ambulatory Visit: Payer: Medicare Other | Attending: Pain Medicine | Admitting: Pain Medicine

## 2015-05-21 ENCOUNTER — Encounter: Payer: Self-pay | Admitting: Pain Medicine

## 2015-05-21 VITALS — BP 136/69 | HR 60 | Temp 96.4°F | Resp 16 | Ht <= 58 in | Wt 188.0 lb

## 2015-05-21 DIAGNOSIS — M179 Osteoarthritis of knee, unspecified: Secondary | ICD-10-CM | POA: Diagnosis not present

## 2015-05-21 DIAGNOSIS — M5030 Other cervical disc degeneration, unspecified cervical region: Secondary | ICD-10-CM

## 2015-05-21 DIAGNOSIS — M5481 Occipital neuralgia: Secondary | ICD-10-CM

## 2015-05-21 DIAGNOSIS — M545 Low back pain: Secondary | ICD-10-CM | POA: Insufficient documentation

## 2015-05-21 DIAGNOSIS — M170 Bilateral primary osteoarthritis of knee: Secondary | ICD-10-CM

## 2015-05-21 DIAGNOSIS — M25561 Pain in right knee: Secondary | ICD-10-CM | POA: Diagnosis present

## 2015-05-21 DIAGNOSIS — M5136 Other intervertebral disc degeneration, lumbar region: Secondary | ICD-10-CM

## 2015-05-21 DIAGNOSIS — M533 Sacrococcygeal disorders, not elsewhere classified: Secondary | ICD-10-CM

## 2015-05-21 DIAGNOSIS — M48062 Spinal stenosis, lumbar region with neurogenic claudication: Secondary | ICD-10-CM

## 2015-05-21 MED ORDER — CEFAZOLIN SODIUM 1 G IJ SOLR
INTRAMUSCULAR | Status: AC
  Administered 2015-05-21: 1 g via INTRAVENOUS
  Filled 2015-05-21: qty 10

## 2015-05-21 MED ORDER — FENTANYL CITRATE (PF) 100 MCG/2ML IJ SOLN: 100.0000 ug | Freq: Once | INTRAMUSCULAR | Status: DC

## 2015-05-21 MED ORDER — LACTATED RINGERS IV SOLN: 1000.0000 mL | INTRAVENOUS | Status: DC

## 2015-05-21 MED ORDER — LIDOCAINE HCL (PF) 1 % IJ SOLN
INTRAMUSCULAR | Status: AC
  Filled 2015-05-21: qty 5

## 2015-05-21 MED ORDER — CEFAZOLIN SODIUM 1-5 GM-% IV SOLN: 1.0000 g | Freq: Once | INTRAVENOUS | Status: DC

## 2015-05-21 MED ORDER — TRIAMCINOLONE ACETONIDE 40 MG/ML IJ SUSP
INTRAMUSCULAR | Status: AC
  Administered 2015-05-21: 08:00:00
  Filled 2015-05-21: qty 1

## 2015-05-21 MED ORDER — BUPIVACAINE HCL (PF) 0.25 % IJ SOLN: 30.0000 mL | Freq: Once | INTRAMUSCULAR | Status: DC

## 2015-05-21 MED ORDER — FENTANYL CITRATE (PF) 100 MCG/2ML IJ SOLN
INTRAMUSCULAR | Status: AC
  Administered 2015-05-21: 50 ug via INTRAVENOUS
  Filled 2015-05-21: qty 2

## 2015-05-21 MED ORDER — ORPHENADRINE CITRATE 30 MG/ML IJ SOLN
INTRAMUSCULAR | Status: AC
  Filled 2015-05-21: qty 2

## 2015-05-21 MED ORDER — BUPIVACAINE HCL (PF) 0.25 % IJ SOLN
INTRAMUSCULAR | Status: AC
  Administered 2015-05-21: 08:00:00
  Filled 2015-05-21: qty 30

## 2015-05-21 MED ORDER — CEFUROXIME AXETIL 250 MG PO TABS: 250.0000 mg | ORAL_TABLET | Freq: Two times a day (BID) | ORAL | Status: DC

## 2015-05-21 MED ORDER — MIDAZOLAM HCL 5 MG/5ML IJ SOLN: 5.0000 mg | Freq: Once | INTRAMUSCULAR | Status: DC

## 2015-05-21 MED ORDER — LIDOCAINE HCL (PF) 1 % IJ SOLN: 10.0000 mL | Freq: Once | INTRAMUSCULAR | Status: DC

## 2015-05-21 MED ORDER — MIDAZOLAM HCL 5 MG/5ML IJ SOLN
INTRAMUSCULAR | Status: AC
  Administered 2015-05-21: 2 mg via INTRAVENOUS
  Filled 2015-05-21: qty 5

## 2015-05-21 MED ORDER — TRIAMCINOLONE ACETONIDE 40 MG/ML IJ SUSP: 40.0000 mg | Freq: Once | INTRAMUSCULAR | Status: DC

## 2015-05-21 NOTE — Progress Notes (Signed)
Safety precautions to be maintained throughout the outpatient stay will include: orient to surroundings, keep bed in low position, maintain call bell within reach at all times, provide assistance with transfer out of bed and ambulation.  

## 2015-05-21 NOTE — Progress Notes (Signed)
Subjective:    Patient ID: Emily Kemp, female    DOB: Feb 17, 1941, 75 y.o.   MRN: [id number]  HPI  Geniculate nerve blocks of the Right knee   The patient is a 75 y.o. female who returns to the [facility] for further evaluation and treatment of pain involving the lumbar lower extremity region with severe pain of the right knee. Prior studies reveal patient to be with significant degenerative joint disease of the knee.  We will proceed with geniculate nerve blocks of the right knee in an attempt to decrease severity of symptoms, minimize the risk of medication escalation, hopefully retard progression of symptoms and avoid the need for more involved treatment.  The risks benefits and expectations of the procedure were discussed with the patient. The patient was with understanding and in agreement with suggested treatment plan.  DESCRIPTION OF PROCEDURE: Geniculate nerve blocks of the right knee. The  procedure was performed with IV Versed and IV fentanyl, conscious sedation and under fluoroscopic guidance.  NEEDLE PLACEMENT FOR BLOCK OF THE LATERAL SUPERIOR GENICULATE NERVE: The patient was taking to the fluoroscopy suite. With the patient supine, with knee in flexed position, Betadine prep of proposed entry site accomplished.  IV Versed, IV fentanyl conscious sedation, EKG, blood pressure, pulse and pulse oximetry monitoring were all in place. Under fluoroscopic guidance, a 22-gauge needle was inserted in the region of the right knee with needle placed at the lateral border of the femur at the junction of the shaft of the femur and the condyle of the femur.  Following needle placement at the lateral aspect of the knee, needle placement was then accomplished in the region of the medial aspect of the knee.  NEEDLE PLACEMENT FOR BLOCK OF THE MEDIAL SUPERIOR GENICULATE NERVE:  Under fluoroscopic guidance, a 22 - gauge needle was inserted in the region of the right knee with needle placed  at the medial border of the femur at the junction of the shaft of the femur and the condyle of the femur.   NEEDLE PLACEMENT FOR BLOCK OF THE MEDIAL INFERIOR GENICULATE NERVE:  Under fluoroscopic guidance, a 22 - gauge needle was inserted in the region of the right right knee with needle placed at the junction of the shaft and plateau of the tibia.   Following needle placement on AP view of needles placed in all three locations, placement was then verified on lateral view with the tips of the superior lateral and superior medial needles documented to be one half the distance of the shaft of the femur and the tip of the inferior medial geniculate needle documented to be one half the distance of the shaft of the tibia.  Following documentation of needle placements on lateral view, each needle was injected with one mL of 0.25% bupivacaine with Kenalog. A total of 10 mg of Kenalog was utilized for the entire procedure. The patient tolerated the procedure well.    PLAN 1. Medications: Continue present medications Robaxin hydrocodone acetaminophen methadone and Mobic  2. Follow-up appointment with PCP Dr Elijio Miles for evaluation of blood pressure and general medical condition. 3. Follow-up surgical evaluation Has been addressed  4. Follow-up neurological evaluation Has been addressed   5. He patient may be a candidate for radiofrequency rhizolysis and other treatment pending response to treatment on today's visit and follow-up evaluation. 6. The patient is advised to adhere to proper body mechanics and avoid activities which appear to aggravate condition   Review of Systems  Objective:   Physical Exam        Assessment & Plan:

## 2015-05-21 NOTE — Patient Instructions (Addendum)
PLAN   Continue present medication Robaxin hydrocodone acetaminophen and methadone and limit Mobic as discussed . Continue Lyrica as prescribed  LYRICA IS 150 mg SIZE . Please get Ceftin antibiotic today and begin taking Ceftin antibiotic as prescribed  F/U PCP Dr.Tejan-Sie  for evaliation of  BP and general medical    F/U surgical evaluation. May consider pending follow-up evaluations  F/U neurological evaluation. May consider pending follow-up evaluations  Use TENS unit as discussed and try to perform mild exercises such as gentle range of motion, stretching and massage  May consider radiofrequency rhizolysis or intraspinal procedures pending response to present treatment and F/U evaluation We will avoid such procedures at this time  Patient to call Pain Management Center should patient have concerns prior to scheduled return appointmentPain Management Discharge Instructions  General Discharge Instructions :  If you need to reach your doctor call: Monday-Friday 8:00 am - 4:00 pm at [phone] or toll free [phone].  After clinic hours [phone] to have operator reach doctor.  Bring all of your medication bottles to all your appointments in the pain clinic.  To cancel or reschedule your appointment with Pain Management please remember to call 24 hours in advance to avoid a fee.  Refer to the educational materials which you have been given on: General Risks, I had my Procedure. Discharge Instructions, Post Sedation.  Post Procedure Instructions:  The drugs you were given will stay in your system until tomorrow, so for the next 24 hours you should not drive, make any legal decisions or drink any alcoholic beverages.  You may eat anything you prefer, but it is better to start with liquids then soups and crackers, and gradually work up to solid foods.  Please notify your doctor immediately if you have any unusual bleeding, trouble breathing or pain that is not related to your  normal pain.  Depending on the type of procedure that was done, some parts of your body may feel week and/or numb.  This usually clears up by tonight or the next day.  Walk with the use of an assistive device or accompanied by an adult for the 24 hours.  You may use ice on the affected area for the first 24 hours.  Put ice in a Ziploc bag and cover with a towel and place against area 15 minutes on 15 minutes off.  You may switch to heat after 24 hours.

## 2015-05-22 ENCOUNTER — Telehealth: Payer: Self-pay | Admitting: *Deleted

## 2015-05-22 ENCOUNTER — Other Ambulatory Visit: Payer: Self-pay | Admitting: *Deleted

## 2015-05-22 DIAGNOSIS — R920 Mammographic microcalcification found on diagnostic imaging of breast: Secondary | ICD-10-CM

## 2015-05-22 NOTE — Telephone Encounter (Signed)
No problems post procedure. 

## 2015-05-27 ENCOUNTER — Encounter: Payer: Self-pay | Admitting: *Deleted

## 2015-05-28 ENCOUNTER — Encounter: Payer: Self-pay | Admitting: Dietician

## 2015-05-28 ENCOUNTER — Encounter: Payer: Medicare Other | Attending: Internal Medicine | Admitting: Dietician

## 2015-05-28 VITALS — Ht <= 58 in | Wt 186.5 lb

## 2015-05-28 DIAGNOSIS — G4730 Sleep apnea, unspecified: Secondary | ICD-10-CM | POA: Insufficient documentation

## 2015-05-28 DIAGNOSIS — E785 Hyperlipidemia, unspecified: Secondary | ICD-10-CM | POA: Diagnosis not present

## 2015-05-28 DIAGNOSIS — K219 Gastro-esophageal reflux disease without esophagitis: Secondary | ICD-10-CM | POA: Diagnosis not present

## 2015-05-28 DIAGNOSIS — E119 Type 2 diabetes mellitus without complications: Secondary | ICD-10-CM | POA: Diagnosis not present

## 2015-05-28 DIAGNOSIS — E669 Obesity, unspecified: Secondary | ICD-10-CM | POA: Diagnosis present

## 2015-05-28 DIAGNOSIS — I509 Heart failure, unspecified: Secondary | ICD-10-CM | POA: Insufficient documentation

## 2015-05-28 DIAGNOSIS — I10 Essential (primary) hypertension: Secondary | ICD-10-CM | POA: Insufficient documentation

## 2015-05-28 NOTE — Progress Notes (Signed)
Medical Nutrition Therapy: Visit start time: [id number]  end time: 1145 Assessment:  Diagnosis: Diabetes (Type 2), obesity Past medical history: HTN, hyperlipidemia, CHF, sleep apnea, GI reflux Psychosocial issues/ stress concerns: none Preferred learning method:  . Auditory . Visual  Current weight: 186.5lbs  Height: 4'10" Medications, supplements: reviewed list in chart with patient  Progress and evaluation: Patient reports difficulty losing weight with decreased mobility.          She voices understanding of basic meal planning, but states she has had difficulty adhering to diet.         She enjoys a sweet snack nightly and feels this is a small pleasure in her life as she is unable to enjoy many other pleasures.    Physical activity: none, limited mobility      Has tried some water exercise, but has difficulty getting to pool facility.   Dietary Intake:  Usual eating pattern includes 3 meals and 1-2 snacks per day. Dining out frequency: unknown  Breakfast: eggs with 1piece bread, or oatmeal with Splenda, occasionally [place] toast; coffee with lowfat milk and Splenda Snack: none Lunch: hot cheese sandwich with American cheese; water or coffee Snack: sometimes another cheese sandwich Supper: husband usually cooks: chicken in olive oil with mashed potatoes and broccoli or peas; or spaghetti with lean meat; or chicken gumbo or bean soup  Recently quick meals such as chicken pot pie Snack: small bowl of ice cream with either strawberries, apple pie, or cookies Beverages: water, coffee  Nutrition Care Education: Topics covered: Diabetes, weight management Basic nutrition: basic food groups, appropriate nutrient balance Weight control: benefits of weight control, behavioral changes for weight loss: portion control, importance of low fat and low sugar choices,      Using small plates for meals, adding vegetables for volume with few calories. Diabetes: appropriate carb intake and balance,  guidance for 1100kcal diet for weight loss with 45-50% CHO (9 servings to meet minimum needs), 20-25%protein, 30% fat. Other lifestyle changes: Discussed possible options for exercise, such as water exercise, chair exercise.  Nutritional Diagnosis:  Alto-3.3 Overweight/obesity As related to limited mobility, possible emotional eating.  As evidenced by patient report.  Intervention: Instruction as noted above.   Set goals with patient input; she feels she can reduce portions of nighttime sweets and include more fruit.    Patient and husband would like to wait before scheduling follow-up; will contact them in about 1 month to check on progress.  Education Materials given:  . Food lists/ Planning A Balanced Meal . Sample meal pattern/ menus: Quick and Healthy Meal Ideas . Goals/ instructions  Learner/ who was taught:  . Patient  . Spouse/ partner  Level of understanding: Marland Kitchen Verbalizes/ demonstrates competency  Demonstrated degree of understanding via:   Teach back Learning barriers: . None  Willingness to learn/ readiness for change: . Acceptance, ready for change  Monitoring and Evaluation:  Dietary intake, exercise, BG control, and body weight      follow up: in 1 month(s) by phone

## 2015-05-28 NOTE — Patient Instructions (Signed)
   Try using portioned, divided plates to plan and serve meals.   Portion desserts, aim for 1/2 cup ice cream, can increase fruit if needed.   Include low-carb vegetables with lunch and dinner as much as possible. Stir-fried, frozen-steamed, raw, are all good choices.   Allow for occasional "treat" meals.   Reward good behavior with things other than food.   Check out some chair exercises.

## 2015-06-05 ENCOUNTER — Encounter: Payer: Self-pay | Admitting: Pain Medicine

## 2015-06-05 ENCOUNTER — Ambulatory Visit: Payer: Medicare Other | Attending: Pain Medicine | Admitting: Pain Medicine

## 2015-06-05 VITALS — BP 103/59 | HR 58 | Temp 98.5°F | Resp 18 | Ht <= 58 in | Wt 180.0 lb

## 2015-06-05 DIAGNOSIS — M62830 Muscle spasm of back: Secondary | ICD-10-CM | POA: Diagnosis not present

## 2015-06-05 DIAGNOSIS — M4806 Spinal stenosis, lumbar region: Secondary | ICD-10-CM | POA: Insufficient documentation

## 2015-06-05 DIAGNOSIS — M546 Pain in thoracic spine: Secondary | ICD-10-CM | POA: Diagnosis present

## 2015-06-05 DIAGNOSIS — M51369 Other intervertebral disc degeneration, lumbar region without mention of lumbar back pain or lower extremity pain: Secondary | ICD-10-CM

## 2015-06-05 DIAGNOSIS — M5030 Other cervical disc degeneration, unspecified cervical region: Secondary | ICD-10-CM

## 2015-06-05 DIAGNOSIS — M170 Bilateral primary osteoarthritis of knee: Secondary | ICD-10-CM

## 2015-06-05 DIAGNOSIS — M5136 Other intervertebral disc degeneration, lumbar region: Secondary | ICD-10-CM | POA: Insufficient documentation

## 2015-06-05 DIAGNOSIS — M533 Sacrococcygeal disorders, not elsewhere classified: Secondary | ICD-10-CM

## 2015-06-05 DIAGNOSIS — M48062 Spinal stenosis, lumbar region with neurogenic claudication: Secondary | ICD-10-CM

## 2015-06-05 DIAGNOSIS — M5126 Other intervertebral disc displacement, lumbar region: Secondary | ICD-10-CM | POA: Insufficient documentation

## 2015-06-05 DIAGNOSIS — M542 Cervicalgia: Secondary | ICD-10-CM | POA: Diagnosis present

## 2015-06-05 DIAGNOSIS — M79606 Pain in leg, unspecified: Secondary | ICD-10-CM | POA: Diagnosis present

## 2015-06-05 DIAGNOSIS — M5481 Occipital neuralgia: Secondary | ICD-10-CM

## 2015-06-05 MED ORDER — HYDROCODONE-ACETAMINOPHEN 7.5-325 MG PO TABS: ORAL_TABLET | ORAL | Status: DC

## 2015-06-05 MED ORDER — METHOCARBAMOL 500 MG PO TABS: ORAL_TABLET | ORAL | Status: DC

## 2015-06-05 MED ORDER — METHADONE HCL 5 MG PO TABS: ORAL_TABLET | ORAL | Status: DC

## 2015-06-05 NOTE — Progress Notes (Signed)
   Subjective:    Patient ID: Emily Kemp, female    DOB: 1940-04-30, 75 y.o.   MRN: [id number]  HPI  The patient is a 75 year old female who returns to pain management for further evaluation and treatment of pain involving the neck entire back upper and lower extremity regions. The patient is a pain involving the right knee which was within improvement following geniculate nerve blocks of the knee. We discussed patient's condition and will consider patient for geniculate nerve blocks of the knee to be performed at time of return appointment the patient will continue medications at this time consisting of Lyrica Robaxin Mpbic hydrocodone acetaminophen and methadone. The patient admits to pain involving the cervical thoracic and lumbar regions as well. We will consider geniculate nerve blocks of need to be performed at time return appointment and continue medications as prescribed. All agreed to suggested treatment plan        Review of Systems     Objective:   Physical Exam  There was tenderness to palpation of paraspinal muscular treat and cervical region cervical facet region with tenderness over the splenius capitis and occipitalis musculature regions of mild degree with mild tenderness of the acromioclavicular and glenohumeral joint regions. The patient was with unremarkable Spurling's maneuver. Palpation over the thoracic paraspinal must reason thoracic facet region was attends to palpation with no crepitus of the thoracic region noted. There was evidence of muscle spasm involving the lower thoracic paraspinal musculature region with no crepitus of the thoracic region noted. The patient was with decreased grip strength and Tinel and Phalen's maneuver were without increased pain of significant degree. Palpation over the lumbar paraspinal must reason lumbar facet region was with moderate tends to palpation with moderate tenderness of the PSIS and PII S region. Straight leg raising limited  to 20 without increased pain with dorsiflexion noted. The knees was a tennis to palpation with no Formation of the knee noted there was crepitus of the knee with negative anterior and posterior drawer signs Manipulation of the knee reproduces moderate to moderately severe pain There was tenderness along the greater trochanteric region iliotibial band region of mild degree. EHL strength was decreased. No definite sensory deficit or dermatomal distribution detected. There was negative clonus negative Homans. 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 Continue Lyrica as prescribed As previously mentioned PLEASE NOTE LYRICA IS 150 mg SIZE    Geniculate nerve blocks of knee to be performed at time of return appointment  F/U PCP Dr.Tejan-Sie  for evaliation of  BP and general medical    F/U surgical evaluation. May consider pending follow-up evaluations  F/U neurological evaluation. May consider pending follow-up evaluations  Use TENS unit as discussed and try to perform mild exercises such as gentle range of motion, stretching and massage  May consider radiofrequency rhizolysis or intraspinal procedures pending response to present treatment and F/U evaluation We will avoid such procedures at this time  Patient to call Pain Management Center should patient have concerns prior to scheduled return appointment

## 2015-06-05 NOTE — Progress Notes (Signed)
Safety precautions to be maintained throughout the outpatient stay will include: orient to surroundings, keep bed in low position, maintain call bell within reach at all times, provide assistance with transfer out of bed and ambulation.  

## 2015-06-05 NOTE — Patient Instructions (Addendum)
PLAN   Continue present medication Robaxin hydrocodone acetaminophen and methadone and limit Mobic as discussed . Continue Lyrica as prescribed As previously mentioned PLEASE NOTE LYRICA IS 150 mg SIZE    Geniculate nerve blocks of knee to be performed at time of return appointment  F/U PCP Dr.Tejan-Sie  for evaliation of  BP and general medical    F/U surgical evaluation. May consider pending follow-up evaluations  F/U neurological evaluation. May consider pending follow-up evaluations  Use TENS unit as discussed and try to perform mild exercises such as gentle range of motion, stretching and massage  May consider radiofrequency rhizolysis or intraspinal procedures pending response to present treatment and F/U evaluation We will avoid such procedures at this time  Patient to call Pain Management Center should patient have concerns prior to scheduled return appointmentKnee Injection A knee injection is a procedure to get medicine into your knee joint. Your health care provider puts a needle into the joint and injects medicine with an attached syringe. The injected medicine may relieve the pain, swelling, and stiffness of arthritis. The injected medicine may also help to lubricate and cushion your knee joint. You may need more than one injection. LET [facility] CARE PROVIDER KNOW ABOUT:  Any allergies you have.  All medicines you are taking, including vitamins, herbs, eye drops, creams, and over-the-counter medicines.  Previous problems you or members of your family have had with the use of anesthetics.  Any blood disorders you have.  Previous surgeries you have had.  Any medical conditions you may have. RISKS AND COMPLICATIONS Generally, this is a safe procedure. However, problems may occur, including:  Infection.  Bleeding.  Worsening symptoms.  Damage to the area around your knee.  Allergic reaction to any of the medicines.  Skin reactions from repeated  injections. BEFORE THE PROCEDURE  Ask your health care provider about changing or stopping your regular medicines. This is especially important if you are taking diabetes medicines or blood thinners.  Plan to have someone take you home after the procedure. PROCEDURE  You will sit or lie down in a position for your knee to be treated.  The skin over your kneecap will be cleaned with a germ-killing solution (antiseptic).  You will be given a medicine that numbs the area (local anesthetic). You may feel some stinging.  After your knee becomes numb, you will have a second injection. This is the medicine. This needle is carefully placed between your kneecap and your knee. The medicine is injected into the joint space.  At the end of the procedure, the needle will be removed.  A bandage (dressing) may be placed over the injection site. The procedure may vary among health care providers and hospitals. AFTER THE PROCEDURE  You may have to move your knee through its full range of motion. This helps to get all of the medicine into your joint space.  Your blood pressure, heart rate, breathing rate, and blood oxygen level will be monitored often until the medicines you were given have worn off.  You will be watched to make sure that you do not have a reaction to the injected medicine.   This information is not intended to replace advice given to you by your health care provider. Make sure you discuss any questions you have with your health care provider.   Document Released: 05/02/2006 Document Revised: 03/01/2014 Document Reviewed: 12/19/2013 Elsevier Interactive Patient Education 2016 [place]  What are the risk, side effects  and possible complications? Generally speaking, most procedures are safe.  However, with any procedure there are risks, side effects, and the possibility of complications.  The risks and complications are dependent upon the sites  that are lesioned, or the type of nerve block to be performed.  The closer the procedure is to the spine, the more serious the risks are.  Great care is taken when placing the radio frequency needles, block needles or lesioning probes, but sometimes complications can occur.  Infection: Any time there is an injection through the skin, there is a risk of infection.  This is why sterile conditions are used for these blocks.  There are four possible types of infection.  Localized skin infection.  Central Nervous System Infection-This can be in the form of Meningitis, which can be deadly.  Epidural Infections-This can be in the form of an epidural abscess, which can cause pressure inside of the spine, causing compression of the spinal cord with subsequent paralysis. This would require an emergency surgery to decompress, and there are no guarantees that the patient would recover from the paralysis.  Discitis-This is an infection of the intervertebral discs.  It occurs in about 1% of discography procedures.  It is difficult to treat and it may lead to surgery.        2. Pain: the needles have to go through skin and soft tissues, will cause soreness.       3. Damage to internal structures:  The nerves to be lesioned may be near blood vessels or    other nerves which can be potentially damaged.       4. Bleeding: Bleeding is more common if the patient is taking blood thinners such as  aspirin, Coumadin, Ticiid, Plavix, etc., or if he/she have some genetic predisposition  such as hemophilia. Bleeding into the spinal canal can cause compression of the spinal  cord with subsequent paralysis.  This would require an emergency surgery to  decompress and there are no guarantees that the patient would recover from the  paralysis.       5. Pneumothorax:  Puncturing of a lung is a possibility, every time a needle is introduced in  the area of the chest or upper back.  Pneumothorax refers to free air around the   collapsed lung(s), inside of the thoracic cavity (chest cavity).  Another two possible  complications related to a similar event would include: Hemothorax and Chylothorax.   These are variations of the Pneumothorax, where instead of air around the collapsed  lung(s), you may have blood or chyle, respectively.       6. Spinal headaches: They may occur with any procedures in the area of the spine.       7. Persistent CSF (Cerebro-Spinal Fluid) leakage: This is a rare problem, but may occur  with prolonged intrathecal or epidural catheters either due to the formation of a fistulous  track or a dural tear.       8. Nerve damage: By working so close to the spinal cord, there is always a possibility of  nerve damage, which could be as serious as a permanent spinal cord injury with  paralysis.       9. Death:  Although rare, severe deadly allergic reactions known as "Anaphylactic  reaction" can occur to any of the medications used.      10. Worsening of the symptoms:  We can always make thing worse.  What are the chances of something like this happening?  Chances of any of this occuring are extremely low.  By statistics, you have more of a chance of getting killed in a motor vehicle accident: while driving to the hospital than any of the above occurring .  Nevertheless, you should be aware that they are possibilities.  In general, it is similar to taking a shower.  Everybody knows that you can slip, hit your head and get killed.  Does that mean that you should not shower again?  Nevertheless always keep in mind that statistics do not mean anything if you happen to be on the wrong side of them.  Even if a procedure has a 1 (one) in a 1,000,000 (million) chance of going wrong, it you happen to be that one..Also, keep in mind that by statistics, you have more of a chance of having something go wrong when taking medications.  Who should not have this procedure? If you are on a blood thinning medication (e.g.  Coumadin, Plavix, see list of "Blood Thinners"), or if you have an active infection going on, you should not have the procedure.  If you are taking any blood thinners, please inform your physician.  How should I prepare for this procedure?  Do not eat or drink anything at least six hours prior to the procedure.  Bring a driver with you .  It cannot be a taxi.  Come accompanied by an adult that can drive you back, and that is strong enough to help you if your legs get weak or numb from the local anesthetic.  Take all of your medicines the morning of the procedure with just enough water to swallow them.  If you have diabetes, make sure that you are scheduled to have your procedure done first thing in the morning, whenever possible.  If you have diabetes, take only half of your insulin dose and notify our nurse that you have done so as soon as you arrive at the clinic.  If you are diabetic, but only take blood sugar pills (oral hypoglycemic), then do not take them on the morning of your procedure.  You may take them after you have had the procedure.  Do not take aspirin or any aspirin-containing medications, at least eleven (11) days prior to the procedure.  They may prolong bleeding.  Wear loose fitting clothing that may be easy to take off and that you would not mind if it got stained with Betadine or blood.  Do not wear any jewelry or perfume  Remove any nail coloring.  It will interfere with some of our monitoring equipment.  NOTE: Remember that this is not meant to be interpreted as a complete list of all possible complications.  Unforeseen problems may occur.  BLOOD THINNERS The following drugs contain aspirin or other products, which can cause increased bleeding during surgery and should not be taken for 2 weeks prior to and 1 week after surgery.  If you should need take something for relief of minor pain, you may take acetaminophen which is found in Tylenol,m Datril, Anacin-3 and  Panadol. It is not blood thinner. The products listed below are.  Do not take any of the products listed below in addition to any listed on your instruction sheet.  A.P.C or A.P.C with Codeine Codeine Phosphate Capsules #3 Ibuprofen Ridaura  ABC compound Congesprin Imuran rimadil  Advil Cope Indocin Robaxisal  Alka-Seltzer Effervescent Pain Reliever and Antacid Coricidin or Coricidin-D  Indomethacin Rufen  Alka-Seltzer plus Cold Medicine Cosprin Ketoprofen S-A-C Tablets  Anacin Analgesic Tablets or Capsules Coumadin Korlgesic Salflex  Anacin Extra Strength Analgesic tablets or capsules CP-2 Tablets Lanoril Salicylate  Anaprox Cuprimine Capsules Levenox Salocol  Anexsia-D Dalteparin Magan Salsalate  Anodynos Darvon compound Magnesium Salicylate Sine-off  Ansaid Dasin Capsules Magsal Sodium Salicylate  Anturane Depen Capsules Marnal Soma  APF Arthritis pain formula Dewitt's Pills Measurin Stanback  Argesic Dia-Gesic Meclofenamic Sulfinpyrazone  Arthritis Bayer Timed Release Aspirin Diclofenac Meclomen Sulindac  Arthritis pain formula Anacin Dicumarol Medipren Supac  Analgesic (Safety coated) Arthralgen Diffunasal Mefanamic Suprofen  Arthritis Strength Bufferin Dihydrocodeine Mepro Compound Suprol  Arthropan liquid Dopirydamole Methcarbomol with Aspirin Synalgos  ASA tablets/Enseals Disalcid Micrainin Tagament  Ascriptin Doan's Midol Talwin  Ascriptin A/D Dolene Mobidin Tanderil  Ascriptin Extra Strength Dolobid Moblgesic Ticlid  Ascriptin with Codeine Doloprin or Doloprin with Codeine Momentum Tolectin  Asperbuf Duoprin Mono-gesic Trendar  Aspergum Duradyne Motrin or Motrin IB Triminicin  Aspirin plain, buffered or enteric coated Durasal Myochrisine Trigesic  Aspirin Suppositories Easprin Nalfon Trillsate  Aspirin with Codeine Ecotrin Regular or Extra Strength Naprosyn Uracel  Atromid-S Efficin Naproxen Ursinus  Auranofin Capsules Elmiron Neocylate Vanquish  Axotal Emagrin Norgesic  Verin  Azathioprine Empirin or Empirin with Codeine Normiflo Vitamin E  Azolid Emprazil Nuprin Voltaren  Bayer Aspirin plain, buffered or children's or timed BC Tablets or powders Encaprin Orgaran Warfarin Sodium  Buff-a-Comp Enoxaparin Orudis Zorpin  Buff-a-Comp with Codeine Equegesic Os-Cal-Gesic   Buffaprin Excedrin plain, buffered or Extra Strength Oxalid   Bufferin Arthritis Strength Feldene Oxphenbutazone   Bufferin plain or Extra Strength Feldene Capsules Oxycodone with Aspirin   Bufferin with Codeine Fenoprofen Fenoprofen Pabalate or Pabalate-SF   Buffets II Flogesic Panagesic   Buffinol plain or Extra Strength Florinal or Florinal with Codeine Panwarfarin   Buf-Tabs Flurbiprofen Penicillamine   Butalbital Compound Four-way cold tablets Penicillin   Butazolidin Fragmin Pepto-Bismol   Carbenicillin Geminisyn Percodan   Carna Arthritis Reliever Geopen Persantine   Carprofen Gold's salt Persistin   Chloramphenicol Goody's Phenylbutazone   Chloromycetin Haltrain Piroxlcam   Clmetidine heparin Plaquenil   Cllnoril Hyco-pap Ponstel   Clofibrate Hydroxy chloroquine Propoxyphen         Before stopping any of these medications, be sure to consult the physician who ordered them.  Some, such as Coumadin (Warfarin) are ordered to prevent or treat serious conditions such as "deep thrombosis", "pumonary embolisms", and other heart problems.  The amount of time that you may need off of the medication may also vary with the medication and the reason for which you were taking it.  If you are taking any of these medications, please make sure you notify your pain physician before you undergo any procedures.

## 2015-06-23 ENCOUNTER — Encounter: Payer: Self-pay | Admitting: Pain Medicine

## 2015-06-23 ENCOUNTER — Ambulatory Visit: Payer: Medicare Other | Attending: Pain Medicine | Admitting: Pain Medicine

## 2015-06-23 VITALS — BP 142/67 | HR 60 | Temp 97.5°F | Resp 16 | Ht <= 58 in | Wt 180.0 lb

## 2015-06-23 DIAGNOSIS — M1711 Unilateral primary osteoarthritis, right knee: Secondary | ICD-10-CM | POA: Insufficient documentation

## 2015-06-23 DIAGNOSIS — M545 Low back pain: Secondary | ICD-10-CM | POA: Diagnosis present

## 2015-06-23 DIAGNOSIS — M5136 Other intervertebral disc degeneration, lumbar region: Secondary | ICD-10-CM

## 2015-06-23 DIAGNOSIS — M170 Bilateral primary osteoarthritis of knee: Secondary | ICD-10-CM

## 2015-06-23 DIAGNOSIS — M5030 Other cervical disc degeneration, unspecified cervical region: Secondary | ICD-10-CM

## 2015-06-23 DIAGNOSIS — M5481 Occipital neuralgia: Secondary | ICD-10-CM

## 2015-06-23 DIAGNOSIS — M25561 Pain in right knee: Secondary | ICD-10-CM | POA: Diagnosis present

## 2015-06-23 DIAGNOSIS — M533 Sacrococcygeal disorders, not elsewhere classified: Secondary | ICD-10-CM

## 2015-06-23 DIAGNOSIS — M48062 Spinal stenosis, lumbar region with neurogenic claudication: Secondary | ICD-10-CM

## 2015-06-23 MED ORDER — LIDOCAINE HCL (PF) 1 % IJ SOLN: 10.0000 mL | Freq: Once | INTRAMUSCULAR | Status: DC

## 2015-06-23 MED ORDER — BUPIVACAINE HCL (PF) 0.25 % IJ SOLN: 30.0000 mL | Freq: Once | INTRAMUSCULAR | Status: DC

## 2015-06-23 MED ORDER — BUPIVACAINE HCL (PF) 0.25 % IJ SOLN
INTRAMUSCULAR | Status: AC
  Administered 2015-06-23: 09:00:00
  Filled 2015-06-23: qty 30

## 2015-06-23 MED ORDER — CEFUROXIME AXETIL 250 MG PO TABS: 250.0000 mg | ORAL_TABLET | Freq: Two times a day (BID) | ORAL | Status: DC

## 2015-06-23 MED ORDER — CEFAZOLIN SODIUM 1 G IJ SOLR
INTRAMUSCULAR | Status: AC
  Administered 2015-06-23: 09:00:00
  Filled 2015-06-23: qty 10

## 2015-06-23 MED ORDER — TRIAMCINOLONE ACETONIDE 40 MG/ML IJ SUSP
INTRAMUSCULAR | Status: AC
  Administered 2015-06-23: 09:00:00
  Filled 2015-06-23: qty 1

## 2015-06-23 MED ORDER — ORPHENADRINE CITRATE 30 MG/ML IJ SOLN: 60.0000 mg | Freq: Once | INTRAMUSCULAR | Status: DC

## 2015-06-23 MED ORDER — CEFAZOLIN SODIUM 1-5 GM-% IV SOLN: 1.0000 g | Freq: Once | INTRAVENOUS | Status: DC

## 2015-06-23 MED ORDER — MIDAZOLAM HCL 5 MG/5ML IJ SOLN
INTRAMUSCULAR | Status: AC
  Administered 2015-06-23: 2 mg
  Filled 2015-06-23: qty 5

## 2015-06-23 MED ORDER — LACTATED RINGERS IV SOLN: 1000.0000 mL | INTRAVENOUS | Status: DC

## 2015-06-23 MED ORDER — LIDOCAINE HCL (PF) 1 % IJ SOLN
INTRAMUSCULAR | Status: AC
  Administered 2015-06-23: 09:00:00
  Filled 2015-06-23: qty 5

## 2015-06-23 MED ORDER — TRIAMCINOLONE ACETONIDE 40 MG/ML IJ SUSP: 40.0000 mg | Freq: Once | INTRAMUSCULAR | Status: DC

## 2015-06-23 MED ORDER — FENTANYL CITRATE (PF) 100 MCG/2ML IJ SOLN
INTRAMUSCULAR | Status: AC
  Administered 2015-06-23: 50 ug
  Filled 2015-06-23: qty 2

## 2015-06-23 MED ORDER — MIDAZOLAM HCL 5 MG/5ML IJ SOLN: 5.0000 mg | Freq: Once | INTRAMUSCULAR | Status: DC

## 2015-06-23 MED ORDER — FENTANYL CITRATE (PF) 100 MCG/2ML IJ SOLN: 100.0000 ug | Freq: Once | INTRAMUSCULAR | Status: DC

## 2015-06-23 NOTE — Progress Notes (Signed)
Subjective:    Patient ID: Emily Kemp, female    DOB: [birth year], 75 y.o.   MRN: [id number]  HPI  Geniculate nerve blocks of the Right knee   The patient is a 75 y.o. female who returns to the [facility] for further evaluation and treatment of pain involving the lumbar lower extremity region with severe pain of the right knee. Prior studies reveal patient to be with significant degenerative joint disease of the knee.  We will proceed with geniculate nerve blocks of the right knee in an attempt to decrease severity of symptoms, minimize the risk of medication escalation, hopefully retard progression of symptoms and avoid the need for more involved treatment.  The risks benefits and expectations of the procedure were discussed with the patient. The patient was with understanding and in agreement with suggested treatment plan.  DESCRIPTION OF PROCEDURE: Geniculate nerve blocks of the right knee. The  procedure was performed with IV Versed and IV fentanyl, conscious sedation and under fluoroscopic guidance.  NEEDLE PLACEMENT FOR BLOCK OF THE LATERAL SUPERIOR GENICULATE NERVE: The patient was taking to the fluoroscopy suite. With the patient supine, with knee in flexed position, Betadine prep of proposed entry site accomplished.  IV Versed, IV fentanyl conscious sedation, EKG, blood pressure, pulse, capnography, and pulse oximetry monitoring were all in place. Under fluoroscopic guidance, a 22-gauge needle was inserted in the region of the right knee with needle placed at the lateral border of the femur at the junction of the shaft of the femur and the condyle of the femur.  Following needle placement at the lateral aspect of the knee, needle placement was then accomplished in the region of the medial aspect of the knee.  NEEDLE PLACEMENT FOR BLOCK OF THE MEDIAL SUPERIOR GENICULATE NERVE:  Under fluoroscopic guidance, a 22 - gauge needle was inserted in the region of the right knee with  needle placed at the medial border of the femur at the junction of the shaft of the femur and the condyle of the femur.   NEEDLE PLACEMENT FOR BLOCK OF THE MEDIAL INFERIOR GENICULATE NERVE:  Under fluoroscopic guidance, a 22 - gauge needle was inserted in the region of the right right knee with needle placed at the junction of the shaft and plateau of the tibia.   Following needle placement on AP view of needles placed in all three locations, placement was then verified on lateral view with the tips of the superior lateral and superior medial needles documented to be one half the distance of the shaft of the femur and the tip of the inferior medial geniculate needle documented to be one half the distance of the shaft of the tibia.  Following documentation of needle placements on lateral view, each needle was injected with one mL of 0.25% bupivacaine with Kenalog. A total of 10 mg of Kenalog was utilized for the entire procedure. The patient tolerated the procedure well.    PLAN 1. Medications: Continue present medications Robaxin hydrocodone acetaminophen and methadone 2. Follow-up appointment with PCP Dr.Tejan-Sie  for evaluation of blood pressure and general medical condition. 3. Follow-up surgical evaluation Has been addressed   4. Follow-up neurological evaluation Has been addressed 5. He patient may be a candidate for radiofrequency rhizolysis and other treatment pending response to treatment on today's visit and follow-up evaluation. 6. The patient is advised to adhere to proper body mechanics and avoid activities which appear to aggravate condition   Review of Systems  Objective:   Physical Exam        Assessment & Plan:

## 2015-06-23 NOTE — Patient Instructions (Addendum)
PLAN   Continue present medication Robaxin hydrocodone acetaminophen and methadone and limit Mobic as discussed . Continue Lyrica as prescribed  LYRICA IS 150 mg SIZE . Please get Ceftin antibiotic today and begin taking Ceftin antibiotic as prescribed  F/U PCP Dr.Tejan-Sie  for evaliation of  BP and general medical    F/U surgical evaluation. May consider pending follow-up evaluations  F/U neurological evaluation. May consider PNCV/ EMG studies and other studies pending follow-up evaluations  Use TENS unit as discussed and try to perform mild exercises such as gentle range of motion, stretching and massage  May consider radiofrequency rhizolysis or intraspinal procedures pending response to present treatment and F/U evaluation We will avoid such procedures at this time  Patient to call Pain Management Center should patient have concerns prior to scheduled return appointment  Pain Management Discharge Instructions  General Discharge Instructions :  If you need to reach your doctor call: Monday-Friday 8:00 am - 4:00 pm at [phone] or toll free [phone].  After clinic hours [phone] to have operator reach doctor.  Bring all of your medication bottles to all your appointments in the pain clinic.  To cancel or reschedule your appointment with Pain Management please remember to call 24 hours in advance to avoid a fee.  Refer to the educational materials which you have been given on: General Risks, I had my Procedure. Discharge Instructions, Post Sedation.  Post Procedure Instructions:  The drugs you were given will stay in your system until tomorrow, so for the next 24 hours you should not drive, make any legal decisions or drink any alcoholic beverages.  You may eat anything you prefer, but it is better to start with liquids then soups and crackers, and gradually work up to solid foods.  Please notify your doctor immediately if you have any unusual bleeding, trouble  breathing or pain that is not related to your normal pain.  Depending on the type of procedure that was done, some parts of your body may feel week and/or numb.  This usually clears up by tonight or the next day.  Walk with the use of an assistive device or accompanied by an adult for the 24 hours.  You may use ice on the affected area for the first 24 hours.  Put ice in a Ziploc bag and cover with a towel and place against area 15 minutes on 15 minutes off.  You may switch to heat after 24 hours.

## 2015-06-23 NOTE — Progress Notes (Signed)
Safety precautions to be maintained throughout the outpatient stay will include: orient to surroundings, keep bed in low position, maintain call bell within reach at all times, provide assistance with transfer out of bed and ambulation.  

## 2015-06-24 ENCOUNTER — Telehealth: Payer: Self-pay

## 2015-06-24 NOTE — Telephone Encounter (Signed)
Post procedure phone call.  States she is doing good.  

## 2015-07-02 ENCOUNTER — Telehealth: Payer: Self-pay | Admitting: Dietician

## 2015-07-02 ENCOUNTER — Encounter: Payer: Self-pay | Admitting: Dietician

## 2015-07-02 NOTE — Progress Notes (Signed)
Sent discharge letter to MD. 

## 2015-07-02 NOTE — Telephone Encounter (Signed)
Called patient to check on progress; she reports having lost about 8lbs in the past month, due to fewer sweets and decreased snacking during the day.  She also reports having started on Januvia in addition to Metformin, which has improved blood sugars.  She does not feel she needs further RD follow up at this time, will send discharge to MD.

## 2015-07-03 ENCOUNTER — Ambulatory Visit: Payer: Medicare Other | Attending: Pain Medicine | Admitting: Pain Medicine

## 2015-07-03 ENCOUNTER — Encounter: Payer: Self-pay | Admitting: Pain Medicine

## 2015-07-03 VITALS — BP 115/59 | HR 73 | Temp 98.3°F | Resp 16 | Ht <= 58 in | Wt 178.0 lb

## 2015-07-03 DIAGNOSIS — M5481 Occipital neuralgia: Secondary | ICD-10-CM

## 2015-07-03 DIAGNOSIS — M170 Bilateral primary osteoarthritis of knee: Secondary | ICD-10-CM

## 2015-07-03 DIAGNOSIS — M533 Sacrococcygeal disorders, not elsewhere classified: Secondary | ICD-10-CM

## 2015-07-03 DIAGNOSIS — M48062 Spinal stenosis, lumbar region with neurogenic claudication: Secondary | ICD-10-CM

## 2015-07-03 DIAGNOSIS — M5136 Other intervertebral disc degeneration, lumbar region: Secondary | ICD-10-CM

## 2015-07-03 DIAGNOSIS — M5030 Other cervical disc degeneration, unspecified cervical region: Secondary | ICD-10-CM

## 2015-07-03 MED ORDER — MELOXICAM 7.5 MG PO TABS: ORAL_TABLET | ORAL | Status: DC

## 2015-07-03 MED ORDER — METHADONE HCL 5 MG PO TABS: ORAL_TABLET | ORAL | Status: DC

## 2015-07-03 MED ORDER — HYDROCODONE-ACETAMINOPHEN 7.5-325 MG PO TABS: ORAL_TABLET | ORAL | Status: DC

## 2015-07-03 MED ORDER — PREGABALIN 150 MG PO CAPS: ORAL_CAPSULE | ORAL | Status: DC

## 2015-07-03 MED ORDER — METHOCARBAMOL 500 MG PO TABS: ORAL_TABLET | ORAL | Status: DC

## 2015-07-03 NOTE — Progress Notes (Signed)
Subjective:    Patient ID: Emily Kemp, female    DOB: 1940-08-02, 75 y.o.   MRN: [id number]  HPI  The patient is a 75 year old female who returns to pain management for further evaluation and treatment of pain involving the upper mid lower back and lower extremity region with pain in the shoulders as well as the lower back lower extremity region with severe pain involving the region of the knees. The patient had some improvement of her pain following geniculate nerve blocks of the right knee. At the present time patient has significant pain involving the left knee. The patient will undergo further orthopedic evaluation for further assessment recommendations regarding patient treatment. The patient states that the pain involves the lower back and lower extremity region is well with pain increasing with activities of daily living and becoming more intense as patient spends time on the feet. We will continue present medications at this time and await patient undergo surgical evaluation of her pain involving the region of the left knee and we will consider additional modifications of treatment pending response to treatment and follow-up evaluation with understanding and agreement suggested treatment plan. We will continue Lyrica Robaxin hydrocodone acetaminophen and methadone. Patient tolerating medications well without undesirable side effects      Review of Systems     Objective:   Physical Exam  There was tenderness to palpation of the splenius capitis and occipitalis musculature region cervical facet cervical paraspinal musculature region of moderate degree. There was tenderness of the acromioclavicular and glenohumeral joint regions of moderate degree. The patient was with unremarkable Spurling's maneuver and was with tenderness of the acromioclavicular and glenohumeral joint regions a moderate degree to moderately severe degree. Palpation over the thoracic paraspinal musculature and  thoracic facet region was attends to palpation of moderate degree with mild to moderate muscle spasm of the lower thoracic paraspinal musculature region predominantly. The patient was with pain involving the lumbar lower extremity region with palpation over the PSIS and PII S region reproducing moderate discomfort. There was moderate to moderately severe tenderness over the region of the lumbar facet lumbar paraspinal musculature region which was exacerbated with lateral bending rotation extension and palpation of the lumbar facets. Greater trochanteric region iliotibial band region associated with mild to moderate discomfort. Palpation of the knees reveal patient to be with tenderness of the knee with negative anterior and posterior drawer signs without ballottement of the patella there was crepitus of the knee of moderate degree. Straight leg raising tolerates 20 without increased pain with dorsiflexion noted. There was negative clonus negative Homans. 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 Continue Lyrica as prescribed As previously mentioned PLEASE NOTE LYRICA IS 150 mg SIZE    F/U PCP Dr.Tejan-Sie  for evaliation of  BP and general medical    F/U surgical evaluation. May consider pending follow-up evaluations  F/U neurological evaluation. May consider pending follow-up evaluations  Use TENS unit as discussed and try to perform mild exercises such as gentle range of motion, stretching and  massage as previously discussed  May  consider radiofrequency rhizolysis or intraspinal procedures pending response to present treatment and F/U evaluation We will avoid such procedures at this time  Patient to call Pain Management Center should patient have concerns prior to scheduled return appointment

## 2015-07-03 NOTE — Patient Instructions (Signed)
PLAN   Continue present medication Robaxin hydrocodone acetaminophen and methadone and limit Mobic as discussed . Continue Lyrica as prescribed As previously mentioned PLEASE NOTE LYRICA IS 150 mg SIZE    F/U PCP Dr.Tejan-Sie  for evaliation of  BP and general medical    F/U surgical evaluation. May consider pending follow-up evaluations  F/U neurological evaluation. May consider pending follow-up evaluations  Use TENS unit as discussed and try to perform mild exercises such as gentle range of motion, stretching and massage as previously discussed  May consider radiofrequency rhizolysis or intraspinal procedures pending response to present treatment and F/U evaluation We will avoid such procedures at this time  Patient to call Pain Management Center should patient have concerns prior to scheduled return appointment

## 2015-07-03 NOTE — Progress Notes (Signed)
Safety precautions to be maintained throughout the outpatient stay will include: orient to surroundings, keep bed in low position, maintain call bell within reach at all times, provide assistance with transfer out of bed and ambulation.  

## 2015-07-12 LAB — TOXASSURE SELECT 13 (MW), URINE

## 2015-07-14 NOTE — Progress Notes (Signed)
Quick Note:  Reviewed. ______ 

## 2015-07-15 ENCOUNTER — Other Ambulatory Visit: Payer: Self-pay | Admitting: Pain Medicine

## 2015-07-16 ENCOUNTER — Other Ambulatory Visit: Payer: Self-pay | Admitting: Orthopedic Surgery

## 2015-07-16 DIAGNOSIS — M2392 Unspecified internal derangement of left knee: Secondary | ICD-10-CM

## 2015-07-16 DIAGNOSIS — M25562 Pain in left knee: Secondary | ICD-10-CM

## 2015-07-16 DIAGNOSIS — M1712 Unilateral primary osteoarthritis, left knee: Secondary | ICD-10-CM

## 2015-07-28 ENCOUNTER — Ambulatory Visit
Admission: RE | Admit: 2015-07-28 | Discharge: 2015-07-28 | Disposition: A | Discharge status: Home / Self Care | Payer: Medicare Other | Source: Ambulatory Visit | Attending: General Surgery | Admitting: General Surgery

## 2015-07-28 ENCOUNTER — Other Ambulatory Visit: Payer: Self-pay | Admitting: General Surgery

## 2015-07-28 DIAGNOSIS — R920 Mammographic microcalcification found on diagnostic imaging of breast: Secondary | ICD-10-CM | POA: Diagnosis present

## 2015-07-28 DIAGNOSIS — Z1231 Encounter for screening mammogram for malignant neoplasm of breast: Secondary | ICD-10-CM

## 2015-07-31 ENCOUNTER — Ambulatory Visit: Payer: Medicare Other | Attending: Pain Medicine | Admitting: Pain Medicine

## 2015-07-31 ENCOUNTER — Encounter: Payer: Self-pay | Admitting: Pain Medicine

## 2015-07-31 VITALS — BP 105/77 | HR 57 | Temp 98.0°F | Resp 16 | Ht <= 58 in | Wt 176.0 lb

## 2015-07-31 DIAGNOSIS — M533 Sacrococcygeal disorders, not elsewhere classified: Secondary | ICD-10-CM | POA: Insufficient documentation

## 2015-07-31 DIAGNOSIS — R51 Headache: Secondary | ICD-10-CM | POA: Diagnosis present

## 2015-07-31 DIAGNOSIS — M5030 Other cervical disc degeneration, unspecified cervical region: Secondary | ICD-10-CM

## 2015-07-31 DIAGNOSIS — M5481 Occipital neuralgia: Secondary | ICD-10-CM

## 2015-07-31 DIAGNOSIS — M5136 Other intervertebral disc degeneration, lumbar region: Secondary | ICD-10-CM

## 2015-07-31 DIAGNOSIS — M170 Bilateral primary osteoarthritis of knee: Secondary | ICD-10-CM | POA: Insufficient documentation

## 2015-07-31 DIAGNOSIS — M48062 Spinal stenosis, lumbar region with neurogenic claudication: Secondary | ICD-10-CM

## 2015-07-31 DIAGNOSIS — M4806 Spinal stenosis, lumbar region: Secondary | ICD-10-CM | POA: Diagnosis not present

## 2015-07-31 DIAGNOSIS — M5126 Other intervertebral disc displacement, lumbar region: Secondary | ICD-10-CM | POA: Diagnosis not present

## 2015-07-31 DIAGNOSIS — M542 Cervicalgia: Secondary | ICD-10-CM | POA: Diagnosis present

## 2015-07-31 MED ORDER — METHOCARBAMOL 500 MG PO TABS: ORAL_TABLET | ORAL | Status: DC

## 2015-07-31 MED ORDER — HYDROCODONE-ACETAMINOPHEN 7.5-325 MG PO TABS: ORAL_TABLET | ORAL | Status: DC

## 2015-07-31 MED ORDER — PREGABALIN 150 MG PO CAPS: ORAL_CAPSULE | ORAL | Status: DC

## 2015-07-31 MED ORDER — METHADONE HCL 5 MG PO TABS: ORAL_TABLET | ORAL | Status: DC

## 2015-07-31 NOTE — Progress Notes (Signed)
Safety precautions to be maintained throughout the outpatient stay will include: orient to surroundings, keep bed in low position, maintain call bell within reach at all times, provide assistance with transfer out of bed and ambulation.  

## 2015-07-31 NOTE — Progress Notes (Signed)
Subjective:    Patient ID: Emily Kemp, female    DOB: Jun 22, 1940, 75 y.o.   MRN: [id number]  HPI  The patient is a 75 year old female who returns to pain management for further evaluation and treatment of pain involving the neck upper extremity regions headaches entire back lower back lower extremity region. The patient recently underwent evaluation of pain of the knees and was seen by Lars Masson. The patient will undergo MRI of the left knee at this time and will undergo surgical reevaluation. We will remain available to consider modifications of treatment regimen including modification of medications as well as additional interventional treatment pending surgical disposition. The patient denied any recent trauma change in events of daily living the call significant change in symptomatology. We will continue presently prescribed medications including Lyrica Robaxin hydrocodone acetaminophen and methadone. The patient has been cautioned to limit Mobic.. All agreed to suggested treatment plan   Review of Systems     Objective:   Physical Exam  There was tenderness to palpation of paraspinal musculatures region the cervical region cervical facet region with limited range of motion of the cervical spine noted. Palpation of the splenius capitis and occipitalis regions reproduce moderate discomfort. There was tenderness over the region of the acromioclavicular and glenohumeral joint region a moderate degree and patient was with unremarkable Spurling's maneuver. The patient was a slightly decreased grip strength with Tinel and Phalen's maneuver reproducing minimal discomfort. She over the region of the thoracic region was with no crepitus of the thoracic region noted there was moderate muscle spasm of the thoracic musculature region noted. Palpation over the lumbar paraspinal musculature region lumbar facet region was with moderate tenderness to palpation noted as well. Palpation over the PSIS and  PII S region reproduces moderate discomfort. There was straight leg raising limited to 20 without a increase of pain with dorsiflexion noted. No definite sensory deficit or dermatomal distribution detected. The knees were crepitus of the knees and tenderness of the knees with negative anterior and posterior drawer signs without ballottement of the patella. DTRs were difficult to elicit deficit there was no sensory deficit of dermatomal distribution of the lower extremity is noted. There was negative clonus negative Homans. 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 Continue Lyrica as prescribed As previously mentioned PLEASE NOTE LYRICA IS 150 mg SIZE   Apply  Voltaren Gel to knees as discussed  F/U PCP Dr.Tejan-Sie  for evaliation of  BP and general medical    F/U surgical evaluation. . Patient will undergo further orthopedic evaluation of knees as planned at this time including MRI of the left knee  F/U neurological evaluation. May consider pending follow-up evaluations  Use TENS unit as discussed and try to perform mild exercises such as gentle range of motion, stretching and massage as previously discussed  May consider radiofrequency rhizolysis or intraspinal procedures pending response to present treatment and F/U evaluation We will avoid such procedures at this time  Patient to call Pain Management Center should patient have concerns  prior to scheduled return appointment

## 2015-07-31 NOTE — Patient Instructions (Addendum)
PLAN   Continue present medication Robaxin hydrocodone acetaminophen and methadone and limit Mobic as discussed . Continue Lyrica as prescribed As previously mentioned PLEASE NOTE LYRICA IS 150 mg SIZE   Apply  Voltaren Gel to knees as discussed  F/U PCP Dr.Tejan-Sie  for evaliation of  BP and general medical    F/U surgical evaluation. May consider pending follow-up evaluations  F/U neurological evaluation. May consider pending follow-up evaluations  Use TENS unit as discussed and try to perform mild exercises such as gentle range of motion, stretching and massage as previously discussed  May consider radiofrequency rhizolysis or intraspinal procedures pending response to present treatment and F/U evaluation We will avoid such procedures at this time  Patient to call Pain Management Center should patient have concerns prior to scheduled return appointment

## 2015-08-06 ENCOUNTER — Ambulatory Visit: Payer: Medicare Other | Admitting: General Surgery

## 2015-08-07 ENCOUNTER — Ambulatory Visit
Admission: RE | Admit: 2015-08-07 | Discharge: 2015-08-07 | Disposition: A | Discharge status: Home / Self Care | Payer: Medicare Other | Source: Ambulatory Visit | Attending: Orthopedic Surgery | Admitting: Orthopedic Surgery

## 2015-08-07 DIAGNOSIS — S83232A Complex tear of medial meniscus, current injury, left knee, initial encounter: Secondary | ICD-10-CM | POA: Insufficient documentation

## 2015-08-07 DIAGNOSIS — M1712 Unilateral primary osteoarthritis, left knee: Secondary | ICD-10-CM

## 2015-08-07 DIAGNOSIS — M25462 Effusion, left knee: Secondary | ICD-10-CM | POA: Insufficient documentation

## 2015-08-07 DIAGNOSIS — M2392 Unspecified internal derangement of left knee: Secondary | ICD-10-CM

## 2015-08-07 DIAGNOSIS — M25562 Pain in left knee: Secondary | ICD-10-CM

## 2015-08-07 DIAGNOSIS — S83512A Sprain of anterior cruciate ligament of left knee, initial encounter: Secondary | ICD-10-CM | POA: Insufficient documentation

## 2015-08-18 ENCOUNTER — Encounter: Payer: Self-pay | Admitting: General Surgery

## 2015-08-18 ENCOUNTER — Ambulatory Visit (INDEPENDENT_AMBULATORY_CARE_PROVIDER_SITE_OTHER): Payer: Medicare Other | Admitting: General Surgery

## 2015-08-18 VITALS — BP 110/70 | HR 76 | Resp 16 | Ht <= 58 in | Wt 178.0 lb

## 2015-08-18 DIAGNOSIS — R920 Mammographic microcalcification found on diagnostic imaging of breast: Secondary | ICD-10-CM

## 2015-08-18 NOTE — Progress Notes (Signed)
Patient ID: Emily Kemp, female   DOB: 06/16/1940, 75 y.o.   MRN: [id number]  Chief Complaint  Patient presents with  . Follow-up    mammogram    HPI Emily Kemp is a 75 y.o. female who presents for a breast evaluation. The most recent mammogram was done on 07/28/15. Patient does perform regular self breast checks and gets regular mammograms done.  No new breast issues.  She has been having trouble sleeping at night for about 3 months. Her psychologist, Mauricia Area at [facility], tried her on a sleeping medication but it did not help. She sees Dr Elijio Miles next month.  I personally reviewed the patient's history.      HPI  Past Medical History  Diagnosis Date  . Benign neoplasm of skin of trunk, except scrotum   . Hypertension   . Barrett esophagus   . Asthma   . Osteoporosis   . Cystitis   . Arthritis   . Morbid obesity ([place])   . Breast screening, unspecified   . GERD (gastroesophageal reflux disease)   . Screening for obesity   . Psoriasis   . Torn meniscus 2014    right knee  . Lumbar herniated disc   . Scoliosis   . Degenerative disc disease, lumbar   . Sleep apnea   . COPD (chronic obstructive pulmonary disease) [facility])     Past Surgical History  Procedure Laterality Date  . Colonoscopy  2009, 2013    Dr. Dionne Milo  . Abdominal hysterectomy  1996  . Salpingoophorectomy  1996  . Upper gi endoscopy  2012  . Carotid artery surgery  12/2013  . Foot surgery Left   . Tonsillectomy N/A   . Total abdominal hysterectomy N/A   . Breast biopsy Bilateral 1998    neg    Family History  Problem Relation Age of Onset  . Breast cancer Maternal Aunt 55  . Diabetes Maternal Aunt   . Breast cancer Paternal Aunt 77  . Diabetes Paternal Aunt   . Arthritis Mother   . COPD Mother   . Diabetes Mother   . Heart disease Mother   . Hypertension Father   . Alcohol abuse Father   . Depression Father   . Early death Father   . Heart disease Father   .  Mental illness Father   . Stroke Father   . Cancer Sister   . Alcohol abuse Brother   . Heart disease Brother   . Early death Paternal Uncle   . Hypertension Paternal Uncle   . Asthma Paternal Grandmother     Social History Social History  Substance Use Topics  . Smoking status: Former Smoker    Quit date: 02/22/1990  . Smokeless tobacco: Never Used  . Alcohol Use: 0.0 oz/week    0 Standard drinks or equivalent per week    Allergies  Allergen Reactions  . Benztropine Other (See Comments)  . Catapres  [Clonidine Hcl]     Other reaction(s): Pruritic rash from adhesive  . Lidocaine     Other reaction(s): Pruritic rash from adhesive  . Lithium     Other reaction(s): Altered mental status (finding)  . Wellbutrin [Bupropion] Hives and Other (See Comments)    rash  . Bacitracin-Polymyxin B Rash  . Latex Rash and Swelling    Other reaction(s): Weal  . Paxil [Paroxetine Hcl] Rash and Other (See Comments)    rash  . Sulfa Antibiotics Rash and Other (See Comments)  rash  . Tape Rash    Current Outpatient Prescriptions  Medication Sig Dispense Refill  . amLODipine-valsartan (EXFORGE) 10-320 MG per tablet Take 1 tablet by mouth 2 (two) times daily. 1/2 in the morning and 1/2 in the evening    . aspirin 325 MG tablet Take 325 mg by mouth daily.    Marland Kitchen atorvastatin (LIPITOR) 20 MG tablet Take 20 mg by mouth daily.    . Benzonatate (TESSALON PERLES PO) Take by mouth. Pt doesn't know dose  Takes as needed    . cloNIDine (CATAPRES) 0.2 MG tablet Take 0.2 mg by mouth 2 (two) times daily.   0  . conjugated estrogens (PREMARIN) vaginal cream Place 1 Applicatorful vaginally as needed.     . diclofenac sodium (VOLTAREN) 1 % GEL I a maximum of 4 g per application to painful area of skin 4 times per day if tolerated 500 g 2  . DULoxetine (CYMBALTA) 60 MG capsule Take 60 mg by mouth 2 (two) times daily.     . fexofenadine (ALLEGRA) 180 MG tablet Take 180 mg by mouth daily. Reported on  07/31/2015    . fluocinonide cream (LIDEX) [id number] % Apply 1 application topically as needed. Reported on 04/16/2015    . fluticasone (VERAMYST) 27.5 MCG/SPRAY nasal spray Place 1 spray into the nose daily. Reported on 07/03/2015    . fluticasone furoate-vilanterol (BREO ELLIPTA) 100-25 MCG/INH AEPB Inhale into the lungs.    Marland Kitchen HYDROcodone-acetaminophen (NORCO) 7.5-325 MG tablet Limit 1 tablet by mouth per day or twice a day if tolerated 60 tablet 0  . lamoTRIgine (LAMICTAL) 200 MG tablet Take 200 mg by mouth 2 (two) times daily. Reported on 07/31/2015    . LORazepam (ATIVAN) 1 MG tablet Take 1 mg by mouth 3 (three) times daily as needed.     . metFORMIN (GLUCOPHAGE) 500 MG tablet Take 1,000 mg by mouth 2 (two) times daily.     . methadone (DOLOPHINE) 5 MG tablet Limit one half tab by mouth per day or every 12 hours if tolerated 30 tablet 0  . methocarbamol (ROBAXIN) 500 MG tablet Limit 1-3 tablets by mouth per day if tolerated 90 tablet 0  . metoprolol (LOPRESSOR) 50 MG tablet Take 50 mg by mouth 2 (two) times daily.    . pantoprazole (PROTONIX) 40 MG tablet Take 40 mg by mouth daily.    . pramipexole (MIRAPEX) 0.25 MG tablet Take 1.5 mg by mouth daily. Reported on 07/31/2015    . pramipexole (MIRAPEX) 1.5 MG tablet Take 1.5 mg by mouth daily. Reported on 04/16/2015    . pregabalin (LYRICA) 150 MG capsule Limit 1 tab by mouth twice per day if tolerated 60 capsule 0  . traZODone (DESYREL) 100 MG tablet Take 300 mg by mouth at bedtime.     . triamcinolone cream (KENALOG) 0.5 % Apply 1 application topically as needed.    . umeclidinium bromide (INCRUSE ELLIPTA) 62.5 MCG/INH AEPB Inhale 1 puff into the lungs daily.     No current facility-administered medications for this visit.    Review of Systems Review of Systems  Constitutional: Negative.   Respiratory: Negative.   Cardiovascular: Negative.     Blood pressure 110/70, pulse 76, resp. rate 16, height 4\' 10"  (1.473 m), weight 178 lb (80.74  kg).  Physical Exam Physical Exam  Constitutional: She is oriented to person, place, and time. She appears well-developed and well-nourished.  HENT:  Mouth/Throat: Oropharynx is clear and moist.  Eyes: Conjunctivae are normal.  No scleral icterus.  Neck: Neck supple.  Cardiovascular: Normal rate, regular rhythm and normal heart sounds.   Pulmonary/Chest: Effort normal and breath sounds normal. Right breast exhibits no inverted nipple, no mass, no nipple discharge, no skin change and no tenderness. Left breast exhibits no inverted nipple, no mass, no nipple discharge, no skin change and no tenderness.  Lymphadenopathy:    She has no cervical adenopathy.    She has no axillary adenopathy.  Neurological: She is alert and oriented to person, place, and time.  Skin: Skin is warm and dry.  Psychiatric: Her behavior is normal.    Data Reviewed Mammograms dated 07/28/2015 were reviewed. Microcalcification unchanged over 2 years. BI-RADS 2. Mammograms recommended in one year.  Assessment    Benign breast exam.    Plan    The patient found the cost of Estrace/Premarin cream to be prohibitive. Her physician can order this to be compounded at the [facility] for significantly lower cost.    Recommend avoiding caffeine intake late in the day. Patient will be asked to return to the office in one year with a bilateral screening mammogram.    PCP: Tejan-Sie This has been scribed by Lesly Rubenstein LPN    Robert Bellow 08/19/2015, 5:11 PM   .

## 2015-08-18 NOTE — Patient Instructions (Signed)
The patient is aware to call back for any questions or concerns.  

## 2015-08-22 DIAGNOSIS — S83232A Complex tear of medial meniscus, current injury, left knee, initial encounter: Secondary | ICD-10-CM | POA: Insufficient documentation

## 2015-08-22 DIAGNOSIS — S83512A Sprain of anterior cruciate ligament of left knee, initial encounter: Secondary | ICD-10-CM | POA: Insufficient documentation

## 2015-08-22 DIAGNOSIS — M1712 Unilateral primary osteoarthritis, left knee: Secondary | ICD-10-CM | POA: Insufficient documentation

## 2015-09-02 ENCOUNTER — Ambulatory Visit: Payer: Medicare Other | Attending: Pain Medicine | Admitting: Pain Medicine

## 2015-09-02 ENCOUNTER — Encounter: Payer: Self-pay | Admitting: Pain Medicine

## 2015-09-02 VITALS — BP 151/72 | HR 63 | Temp 98.2°F | Resp 18 | Ht <= 58 in | Wt 178.0 lb

## 2015-09-02 DIAGNOSIS — M5126 Other intervertebral disc displacement, lumbar region: Secondary | ICD-10-CM | POA: Diagnosis not present

## 2015-09-02 DIAGNOSIS — M546 Pain in thoracic spine: Secondary | ICD-10-CM | POA: Diagnosis present

## 2015-09-02 DIAGNOSIS — M170 Bilateral primary osteoarthritis of knee: Secondary | ICD-10-CM

## 2015-09-02 DIAGNOSIS — M542 Cervicalgia: Secondary | ICD-10-CM | POA: Diagnosis present

## 2015-09-02 DIAGNOSIS — M5030 Other cervical disc degeneration, unspecified cervical region: Secondary | ICD-10-CM

## 2015-09-02 DIAGNOSIS — M4806 Spinal stenosis, lumbar region: Secondary | ICD-10-CM | POA: Diagnosis not present

## 2015-09-02 DIAGNOSIS — M5136 Other intervertebral disc degeneration, lumbar region: Secondary | ICD-10-CM | POA: Insufficient documentation

## 2015-09-02 DIAGNOSIS — M5481 Occipital neuralgia: Secondary | ICD-10-CM

## 2015-09-02 DIAGNOSIS — M533 Sacrococcygeal disorders, not elsewhere classified: Secondary | ICD-10-CM

## 2015-09-02 DIAGNOSIS — M51369 Other intervertebral disc degeneration, lumbar region without mention of lumbar back pain or lower extremity pain: Secondary | ICD-10-CM

## 2015-09-02 DIAGNOSIS — M48062 Spinal stenosis, lumbar region with neurogenic claudication: Secondary | ICD-10-CM

## 2015-09-02 MED ORDER — METHOCARBAMOL 500 MG PO TABS: ORAL_TABLET | ORAL | Status: DC

## 2015-09-02 MED ORDER — MELOXICAM 7.5 MG PO TABS: ORAL_TABLET | ORAL | Status: DC

## 2015-09-02 MED ORDER — HYDROCODONE-ACETAMINOPHEN 7.5-325 MG PO TABS: ORAL_TABLET | ORAL | Status: DC

## 2015-09-02 MED ORDER — PREGABALIN 150 MG PO CAPS: ORAL_CAPSULE | ORAL | Status: DC

## 2015-09-02 MED ORDER — METHADONE HCL 5 MG PO TABS: ORAL_TABLET | ORAL | Status: DC

## 2015-09-02 NOTE — Progress Notes (Signed)
Safety precautions to be maintained throughout the outpatient stay will include: orient to surroundings, keep bed in low position, maintain call bell within reach at all times, provide assistance with transfer out of bed and ambulation.  

## 2015-09-02 NOTE — Progress Notes (Signed)
   Subjective:    Patient ID: Emily Kemp, female    DOB: [birth year], 75 y.o.   MRN: [id number]  HPI  The patient is a 75 year old female who returns to pain management for further evaluation and treatment of pain involving the neck entire back upper and lower extremity region. The patient has had severe pain of the left knee and will undergo total knee replacement as planned. The patient is unable to stand without experiencing severe disabling pain of the left knee. We will avoid interventional treatment and will continue present medications and we will remain available to consider modification of treatment should they be significant change in condition. The patient will call pain management should they be change in condition or change in plan prior to scheduled return appointment. All agreed to suggested treatment plan  Review of Systems     Objective:   Physical Exam   There was tenderness of the splenius capitis and occipitalis region palpation which reproduces pain of moderate degree with moderate tenderness of the cervical and thoracic facets are noted there was mild to moderate tenderness of the acromioclavicular and glenohumeral joint region. The patient was unremarkable Spurling's maneuver. The patient appeared to be with slightly decreased grip strength with Tinel and Phalen's maneuver reproducing minimal discomfort. Palpation of the PSIS and PII S region reproduced moderate discomfort. There was moderate tenderness of the greater trochanteric region iliotibial band region. The knees were attends to palpation with severe tenderness to palpation of the left knee compared to the right knee there was increase laxity of the joint noted. Straight leg raise was tolerated to approximately 20 without increased pain with dorsiflexion noted. No definite sensory deficit or dermatomal distribution detected. There appeared to be negative Homans. 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 Patient is to undergo total knee replacement at this time  F/U neurological evaluation. May consider pending follow-up evaluations  Use TENS unit as discussed and try to perform mild exercises such as gentle range of motion, stretching and massage as previously discussed  May consider radiofrequency rhizolysis or intraspinal procedures pending response to present treatment and F/U evaluation We will avoid such procedures at this time  Patient to call Pain Management Center should patient have concerns prior to scheduled return appointment

## 2015-09-02 NOTE — Patient Instructions (Addendum)
PLAN   Continue present medications Lyrica Robaxin meloxicam hydrocodone acetaminophen and methadone and limit Mobic as discussed .As previously mentioned PLEASE NOTE LYRICA IS 150 mg SIZE   Apply  Voltaren Gel to knees as discussed  F/U PCP Dr.Tejan-Sie  for evaliation of  BP and general medical    F/U surgical evaluation. May consider pending follow-up evaluations  F/U neurological evaluation. May consider pending follow-up evaluations  Use TENS unit as discussed and try to perform mild exercises such as gentle range of motion, stretching and massage as previously discussed  May consider radiofrequency rhizolysis or intraspinal procedures pending response to present treatment and F/U evaluation We will avoid such procedures at this time  Patient to call Pain Management Center should patient have concerns prior to scheduled return appointmentPain Management Discharge Instructions  General Discharge Instructions :  If you need to reach your doctor call: Monday-Friday 8:00 am - 4:00 pm at [phone] or toll free [phone].  After clinic hours [phone] to have operator reach doctor.  Bring all of your medication bottles to all your appointments in the pain clinic.  To cancel or reschedule your appointment with Pain Management please remember to call 24 hours in advance to avoid a fee.  Refer to the educational materials which you have been given on: General Risks, I had my Procedure. Discharge Instructions, Post Sedation.  Post Procedure Instructions:  The drugs you were given will stay in your system until tomorrow, so for the next 24 hours you should not drive, make any legal decisions or drink any alcoholic beverages.  You may eat anything you prefer, but it is better to start with liquids then soups and crackers, and gradually work up to solid foods.  Please notify your doctor immediately if you have any unusual bleeding, trouble breathing or pain that is not related to  your normal pain.  Depending on the type of procedure that was done, some parts of your body may feel week and/or numb.  This usually clears up by tonight or the next day.  Walk with the use of an assistive device or accompanied by an adult for the 24 hours.  You may use ice on the affected area for the first 24 hours.  Put ice in a Ziploc bag and cover with a towel and place against area 15 minutes on 15 minutes off.  You may switch to heat after 24 hours.

## 2015-09-29 ENCOUNTER — Ambulatory Visit: Payer: No Typology Code available for payment source | Admitting: Pain Medicine

## 2015-09-29 DIAGNOSIS — M170 Bilateral primary osteoarthritis of knee: Secondary | ICD-10-CM | POA: Insufficient documentation

## 2015-10-02 ENCOUNTER — Encounter: Payer: Self-pay | Admitting: Pain Medicine

## 2015-10-02 ENCOUNTER — Ambulatory Visit: Payer: Medicare Other | Attending: Pain Medicine | Admitting: Pain Medicine

## 2015-10-02 VITALS — BP 102/64 | HR 59 | Temp 98.3°F | Resp 16 | Ht <= 58 in | Wt 176.0 lb

## 2015-10-02 DIAGNOSIS — M542 Cervicalgia: Secondary | ICD-10-CM | POA: Diagnosis present

## 2015-10-02 DIAGNOSIS — M5126 Other intervertebral disc displacement, lumbar region: Secondary | ICD-10-CM | POA: Diagnosis not present

## 2015-10-02 DIAGNOSIS — M4806 Spinal stenosis, lumbar region: Secondary | ICD-10-CM | POA: Diagnosis not present

## 2015-10-02 DIAGNOSIS — M170 Bilateral primary osteoarthritis of knee: Secondary | ICD-10-CM | POA: Insufficient documentation

## 2015-10-02 DIAGNOSIS — M62830 Muscle spasm of back: Secondary | ICD-10-CM | POA: Diagnosis not present

## 2015-10-02 DIAGNOSIS — M5136 Other intervertebral disc degeneration, lumbar region: Secondary | ICD-10-CM | POA: Insufficient documentation

## 2015-10-02 DIAGNOSIS — M533 Sacrococcygeal disorders, not elsewhere classified: Secondary | ICD-10-CM | POA: Insufficient documentation

## 2015-10-02 DIAGNOSIS — M5481 Occipital neuralgia: Secondary | ICD-10-CM

## 2015-10-02 DIAGNOSIS — M546 Pain in thoracic spine: Secondary | ICD-10-CM | POA: Diagnosis present

## 2015-10-02 DIAGNOSIS — M48062 Spinal stenosis, lumbar region with neurogenic claudication: Secondary | ICD-10-CM

## 2015-10-02 DIAGNOSIS — M5030 Other cervical disc degeneration, unspecified cervical region: Secondary | ICD-10-CM

## 2015-10-02 DIAGNOSIS — M79606 Pain in leg, unspecified: Secondary | ICD-10-CM | POA: Diagnosis present

## 2015-10-02 MED ORDER — PREGABALIN 150 MG PO CAPS: ORAL_CAPSULE | ORAL | 0 refills | Status: DC

## 2015-10-02 MED ORDER — METHADONE HCL 5 MG PO TABS: ORAL_TABLET | ORAL | 0 refills | Status: DC

## 2015-10-02 MED ORDER — METHOCARBAMOL 500 MG PO TABS: ORAL_TABLET | ORAL | 0 refills | Status: DC

## 2015-10-02 MED ORDER — HYDROCODONE-ACETAMINOPHEN 7.5-325 MG PO TABS: ORAL_TABLET | ORAL | 0 refills | Status: DC

## 2015-10-02 MED ORDER — MELOXICAM 7.5 MG PO TABS: ORAL_TABLET | ORAL | 0 refills | Status: DC

## 2015-10-02 NOTE — Patient Instructions (Addendum)
PLAN   Continue present medications Lyrica Robaxin meloxicam hydrocodone acetaminophen and methadone and limit Mobic as discussed .As previously mentioned PLEASE NOTE LYRICA IS 150 mg SIZE   Apply  Voltaren Gel to knees as discussed  F/U PCP Dr.Tejan-Sie  for evaliation of  BP and general medical    F/U surgical evaluation Follow-up surgical evaluation [place] as planned  F/U neurological evaluation. May consider pending follow-up evaluations  Use TENS unit as discussed and try to perform mild exercises such as gentle range of motion, stretching and massage as previously discussed  May consider radiofrequency rhizolysis or intraspinal procedures pending response to present treatment and F/U evaluation We will avoid such procedures at this time  Patient to call Pain Management Center should patient have concerns prior to scheduled return appointment

## 2015-10-02 NOTE — Progress Notes (Signed)
   The patient is a 75 year old female who returns to pain management for further evaluation and treatment of pain involving the neck entire back upper and lower extremity regions. The patient is with significant pain involving the knees and is scheduled to undergo further orthopedic evaluation with plans for surgical intervention for knee replacement. The patient also has pain involving other regions including the shoulders which is aggravated by activities of daily living as well as. We will continue presently prescribed medications and we will remain available to consider interventional treatment should patient be with severely disabling pain despite present treatment regimen. All agreed to suggested treatment plan.    Physical examination  There was tenderness of the splenius capitis and occipitalis musculature regions of moderate degree. There was moderate to moderately severe tenderness of the acromioclavicular and glenohumeral joint regions. The patient was with slightly decreased grip strength with Tinel and Phalen's maneuver reproducing pain of moderate degree. Palpation of the medial and lateral epicondyles reproduce moderate discomfort as well. The patient appeared to be with mild increased pain with Tinel and Phalen's maneuver and was with decreased grip strength. Palpation over the thoracic region was attends to palpation evidence of muscle spasm without crepitus of the thoracic region noted. Palpation over the lumbar region was with increased pain with tenderness over the PSIS and PII S region reproducing moderate discomfort as well. The knees were with tenderness to palpation with increased pain with range of motion maneuvers of the knees. There appeared to be negative anterior and posterior drawer signs without ballottement of the patella. EHL strength appeared to be decreased. No definite sensory deficit or dermatomal distribution detected. There was negative clonus negative Homans. Abdomen  nontender with no costovertebral tenderness noted.     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 The patient is considering knee replacement surgery  F/U neurological evaluation. May consider pending follow-up evaluations  Use TENS unit as discussed and try to perform mild exercises such as gentle range of motion, stretching and massage as previously discussed  May consider radiofrequency rhizolysis or intraspinal procedures pending response to present treatment and F/U evaluation We will avoid such procedures at this time  Patient to call Pain Management Center should patient have concerns prior to scheduled return appointment

## 2015-10-02 NOTE — Progress Notes (Signed)
Safety precautions to be maintained throughout the outpatient stay will include: orient to surroundings, keep bed in low position, maintain call bell within reach at all times, provide assistance with transfer out of bed and ambulation.  

## 2015-10-29 ENCOUNTER — Encounter: Payer: Self-pay | Admitting: Pain Medicine

## 2015-10-29 ENCOUNTER — Ambulatory Visit: Payer: Medicare Other | Attending: Pain Medicine | Admitting: Pain Medicine

## 2015-10-29 VITALS — BP 137/82 | HR 67 | Temp 95.5°F | Resp 15 | Ht <= 58 in | Wt 176.0 lb

## 2015-10-29 DIAGNOSIS — M62830 Muscle spasm of back: Secondary | ICD-10-CM | POA: Insufficient documentation

## 2015-10-29 DIAGNOSIS — M170 Bilateral primary osteoarthritis of knee: Secondary | ICD-10-CM | POA: Diagnosis not present

## 2015-10-29 DIAGNOSIS — M533 Sacrococcygeal disorders, not elsewhere classified: Secondary | ICD-10-CM | POA: Insufficient documentation

## 2015-10-29 DIAGNOSIS — M5136 Other intervertebral disc degeneration, lumbar region: Secondary | ICD-10-CM | POA: Insufficient documentation

## 2015-10-29 DIAGNOSIS — M48062 Spinal stenosis, lumbar region with neurogenic claudication: Secondary | ICD-10-CM

## 2015-10-29 DIAGNOSIS — M542 Cervicalgia: Secondary | ICD-10-CM | POA: Diagnosis present

## 2015-10-29 DIAGNOSIS — M4806 Spinal stenosis, lumbar region: Secondary | ICD-10-CM | POA: Insufficient documentation

## 2015-10-29 DIAGNOSIS — M5481 Occipital neuralgia: Secondary | ICD-10-CM

## 2015-10-29 DIAGNOSIS — Z96653 Presence of artificial knee joint, bilateral: Secondary | ICD-10-CM | POA: Diagnosis not present

## 2015-10-29 DIAGNOSIS — R51 Headache: Secondary | ICD-10-CM | POA: Diagnosis present

## 2015-10-29 MED ORDER — MELOXICAM 7.5 MG PO TABS: ORAL_TABLET | ORAL | 0 refills | Status: AC

## 2015-10-29 MED ORDER — HYDROCODONE-ACETAMINOPHEN 7.5-325 MG PO TABS: ORAL_TABLET | ORAL | 0 refills | Status: AC

## 2015-10-29 MED ORDER — METHADONE HCL 5 MG PO TABS: ORAL_TABLET | ORAL | 0 refills | Status: AC

## 2015-10-29 MED ORDER — METHOCARBAMOL 500 MG PO TABS: ORAL_TABLET | ORAL | 0 refills | Status: AC

## 2015-10-29 MED ORDER — PREGABALIN 150 MG PO CAPS: ORAL_CAPSULE | ORAL | 0 refills | Status: DC

## 2015-10-29 NOTE — Patient Instructions (Signed)
PLAN   Continue present medications Lyrica Robaxin meloxicam hydrocodone acetaminophen and methadone and limit Mobic as discussed .As previously mentioned PLEASE NOTE LYRICA IS 150 mg SIZE   Apply  Voltaren Gel to knees as discussed  F/U PCP Dr.Tejan-Sie  for evaliation of  BP and general medical    F/U surgical evaluation Follow-up surgical evaluation [place] as planned. Surgical follow-up evaluation of knees as discussed  F/U neurological evaluation. May consider pending follow-up evaluations  Use TENS unit as discussed and try to perform mild exercises such as gentle range of motion, stretching and massage as previously discussed  May consider radiofrequency rhizolysis or intraspinal procedures pending response to present treatment and F/U evaluation We will avoid such procedures at this time  Patient to call Pain Management Center should patient have concerns prior to scheduled return appointment

## 2015-10-29 NOTE — Progress Notes (Signed)
The patient is a 75 year old female who returns to pain management for further evaluation and treatment of pain involving the neck associated with headaches as well as pain involving the shoulders elbows mid lower back lower extremity region with pain of the knees of severe degree. The patient is with bone-on-bone condition of the knees and is undergone surgical evaluation with recommendations for surgical procedure consisting of needle placements on the left as well as on the right. The patient present time has decided to avoid surgical intervention with the patient has considered her general medical condition as well as the. For rehabilitation. The patient also states that the lower back lower extremity pain is quite significant and with the replacement of the knees she was still being unable to mobilize due to severe lower back and lower extremity pain. We discussed patient undergoing surgical reevaluation for treatment of the lower back lower extremity region as well as the knees. The patient prefers to avoid surgical intervention. We will continue medications consisting of Lyrica Robaxin hydrocodone acetaminophen meloxicam and abdomen as prescribed and May consider additional treatment including interventional treatment as discussed. All agreed to suggested treatment plan.      Physical examination   There was tenderness to palpation of paraspinal muscular treat and cervical region cervical facet region palpation which reproduces pain of moderate degree with moderate tenderness of the splenius capitis and occipitalis region. Palpation of the acromioclavicular and glenohumeral joint regions reproduce moderate discomfort as well. The patient was with decreased grip strength with tenderness to palpation of the medial and lateral epicondyles of the elbow on the left as well as on the right. Tinel and Phalen's maneuver without increased pain of significant degree. Palpation over the region of the  lower thoracic region with moderate muscle spasm without crepitus of the thoracic region. Palpation over the lumbar paraspinal must reason lumbar facet region was with moderate to moderately severe discomfort with lateral bending rotation extension and palpation of the lumbar facets reproducing moderate to moderately severe discomfort. Straight leg raising was tolerates approximately 20 without increased pain with dorsiflexion noted. The knees were with severe tenderness to palpation with range of motion maneuvers of the knees. There appeared to be severe tenderness to palpation of both knees with slight ballottement of the patella. EHL strength was decreased. There was negative clonus negative Homans. No sensory deficit or dermatomal distribution detected. 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 Surgical follow-up evaluation of knees as discussed. The patient is status post recent surgical evaluation and is without plans for surgical intervention of the knees or the lumbar region  F/U neurological evaluation. May consider pending follow-up evaluations at the present time we will avoid additional neurological studies  Use TENS unit as discussed and try  to  perform mild exercises such as gentle range of motion, stretching and massage as previously discussed  May consider radiofrequency rhizolysis or intraspinal procedures pending response to present treatment and F/U evaluation We will avoid such procedures at this time  Patient to call Pain Management Center should patient have concerns prior to scheduled return appointment

## 2015-10-29 NOTE — Progress Notes (Signed)
Safety precautions to be maintained throughout the outpatient stay will include: orient to surroundings, keep bed in low position, maintain call bell within reach at all times, provide assistance with transfer out of bed and ambulation.  May have knee surgery. X-rays of knees at [facility]

## 2015-10-30 ENCOUNTER — Ambulatory Visit: Payer: No Typology Code available for payment source | Admitting: Pain Medicine

## 2015-11-07 IMAGING — CT CT MAXILLOFACIAL W/O CM
3 series · 15 of 47 positions shown, 18 images · non-contrast
Comparison: Head CT 01/28/2013

CLINICAL DATA: Maxillary sinus congestion worsening over the last
year.

EXAM:
CT MAXILLOFACIAL WITHOUT CONTRAST
TECHNIQUE: Multidetector CT imaging of the maxillofacial structures was
performed. Multiplanar CT image reconstructions were also generated.
A small metallic BB was placed on the right temple in order to
reliably differentiate right from left.

[Series 3: ax soft · axial · 0.35mm/px · z∈[-156,-15]mm · 9 of 165 slices shown, 12 images]
[im 12/165  brain]
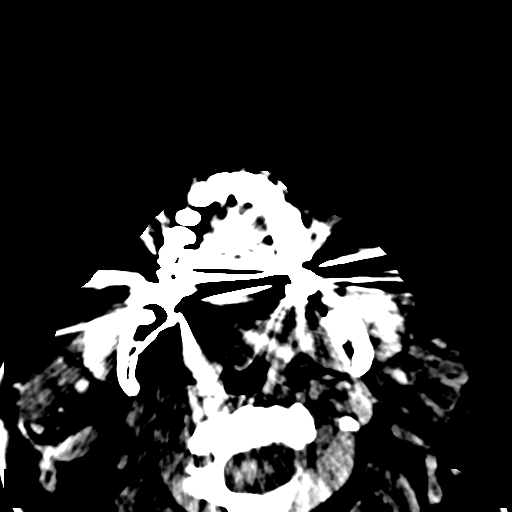
[im 12/165  bone]
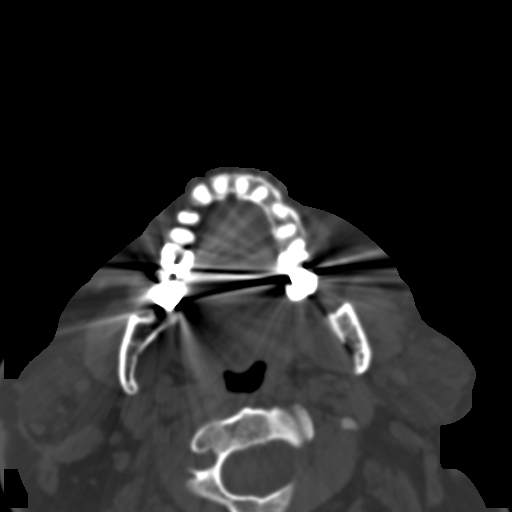
[im 29/165  bone]
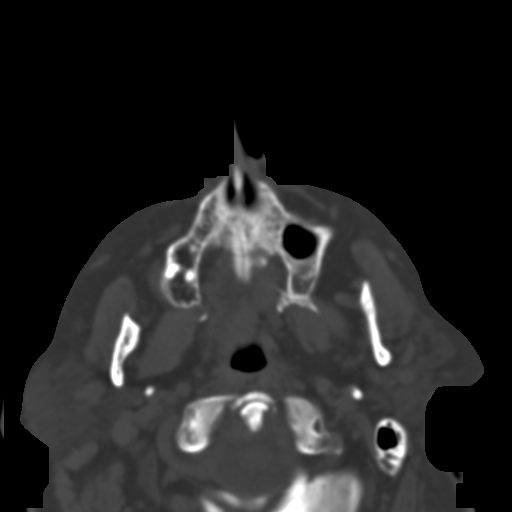
[im 46/165  bone]
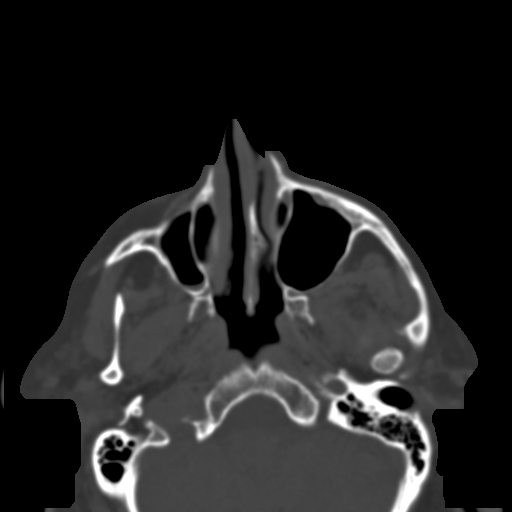
[im 63/165  bone]
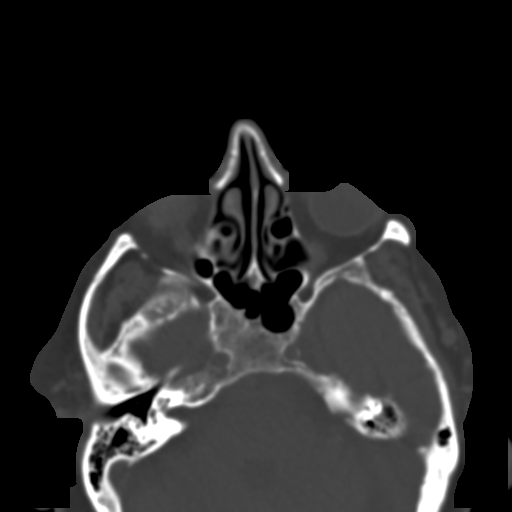
[im 85/165  brain]
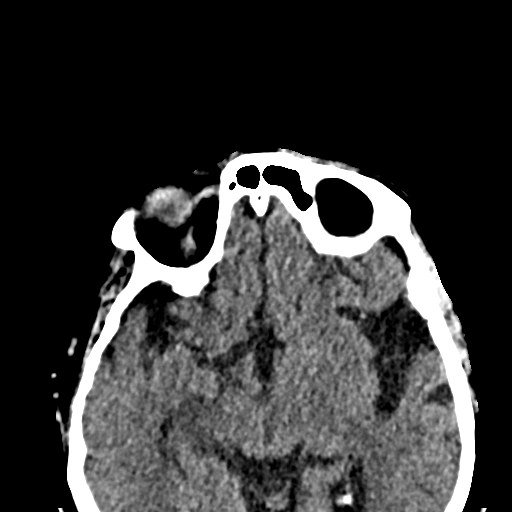
[im 85/165  bone]
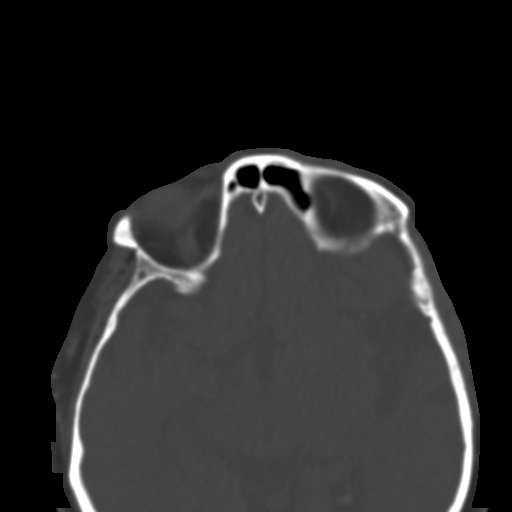
[im 102/165  bone]
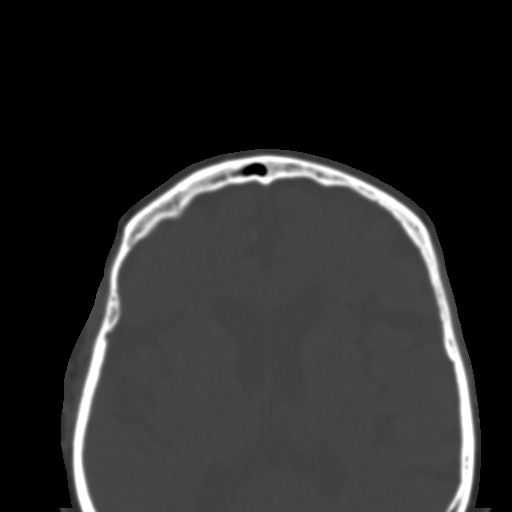
[im 119/165  bone]
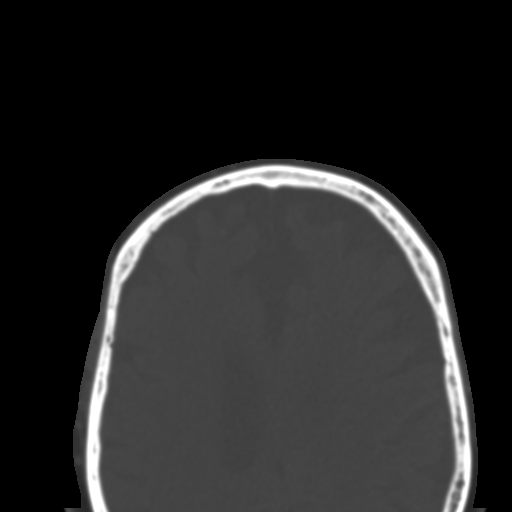
[im 136/165  bone]
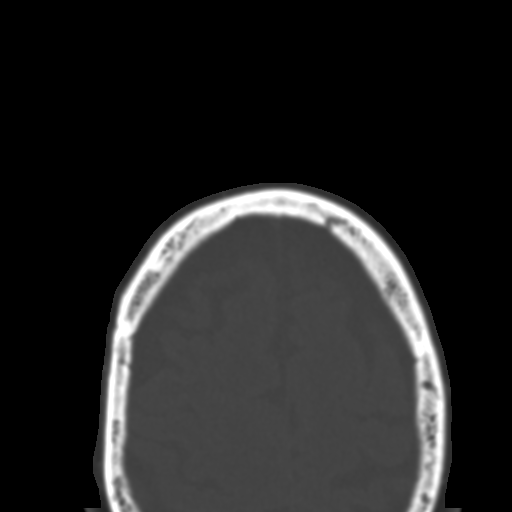
[im 153/165  brain]
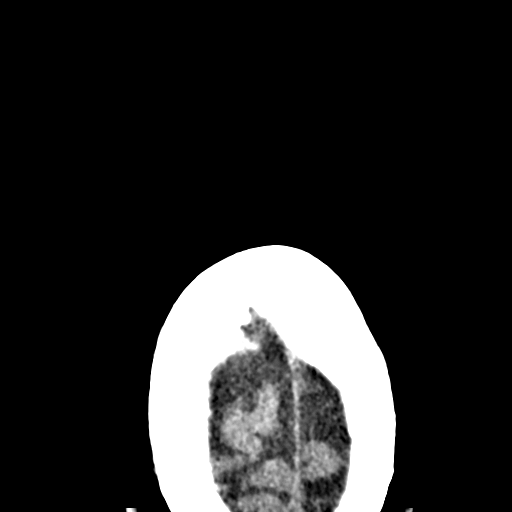
[im 153/165  bone]
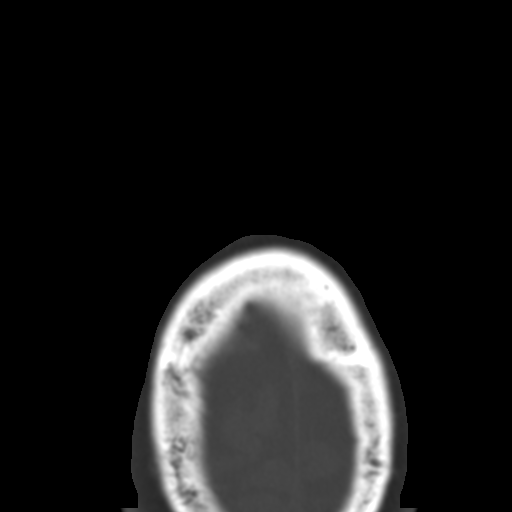

[Series 4: coronal bone · coronal · 0.33mm/px · 3 of 72 slices shown]
[im 24/72  bone]
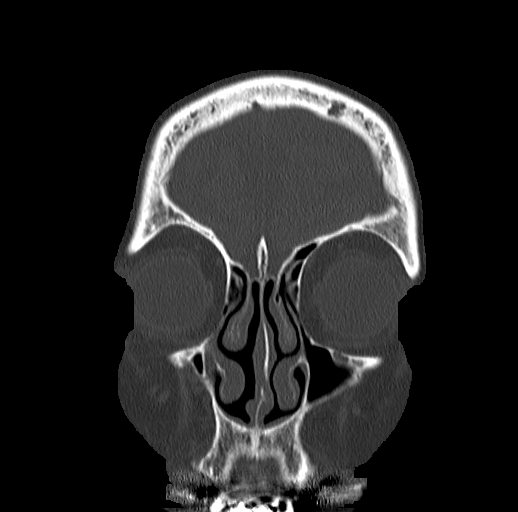
[im 32/72  bone]
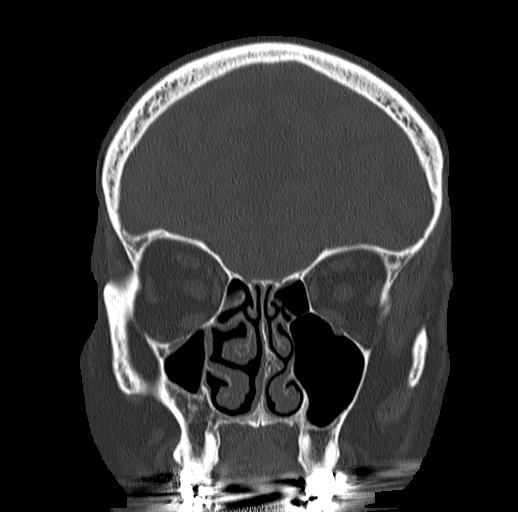
[im 40/72  bone]
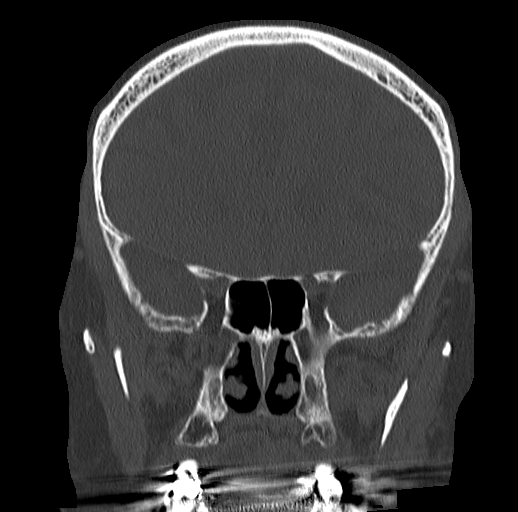

[Series 5: sagittal bone · sagittal · 0.29mm/px · 3 of 90 slices shown]
[im 30/90  bone]
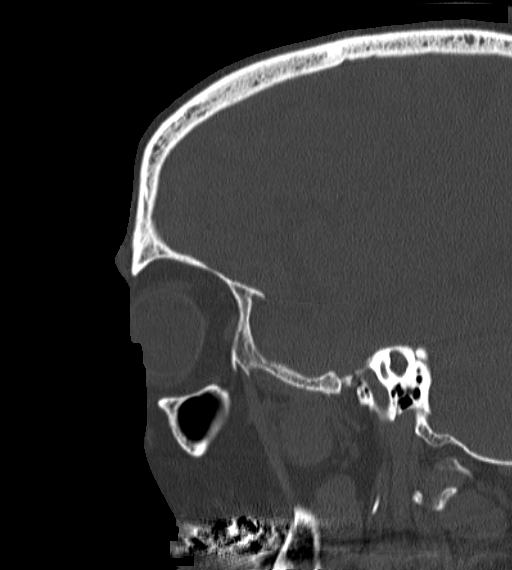
[im 45/90  bone]
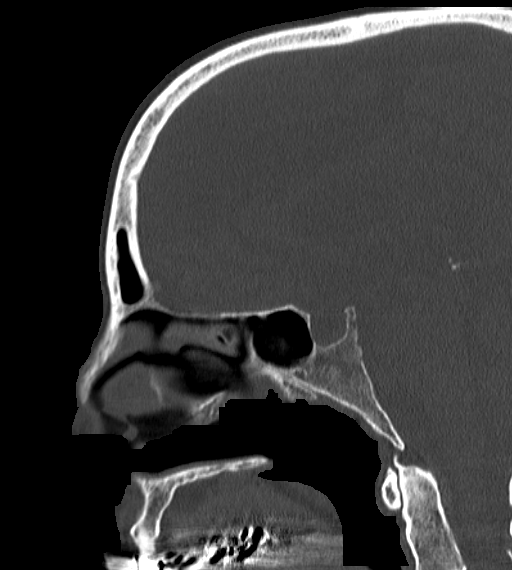
[im 60/90  bone]
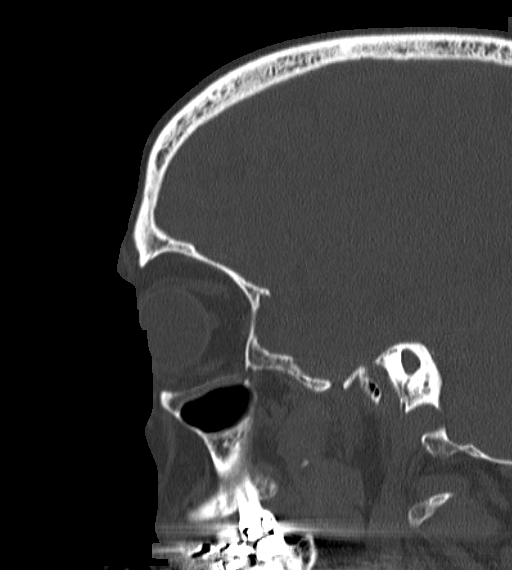

[15 of 47 positions shown; findings below may reference images not displayed]

FINDINGS: Frontal, ethmoid and sphenoid sinuses are clear without mucosal
thickening, fluid, polyp, cyst or mass. The left maxillary sinus is
normal and clear. The right maxillary sinus is hypoplastic. There is
a small amount of dependent layering fluid. The study does not show
a widespread mucosal thickening. There is mild mucosal thickening in
the region of the infundibulum. No focal polyp or cyst.

The nasal septum bows towards the left 2 mm and there is a leftward
projecting nasal septal spur. The nasal passages appear widely
patent bilaterally, capacious on the right compared to the left
because of the hypoplastic maxillary sinus and the nasal septal
deviation towards the left. The ostiomeatal complex on the left is
normal with a widely patent infundibulum and no unfavorable anatomic
variants. The infundibulum on the right is narrow but there is no
specific focal anatomic variant responsible for that.
IMPRESSION: Congenital hypoplasia of the right maxillary sinus. All sinuses are
clear except for a very small amount of dependent layering fluid in
the right maxillary sinus and mild mucosal thickening in the region
of the narrow right maxillary sinus infundibulum. This sinus would
be at risk of obstruction and inflammation based on these findings,
but there is not a pronounced inflammatory pattern presently.

Nasal septal deviation towards the left measuring 2 mm.

## 2016-02-03 ENCOUNTER — Ambulatory Visit (INDEPENDENT_AMBULATORY_CARE_PROVIDER_SITE_OTHER): Payer: Medicare Other | Admitting: General Surgery

## 2016-02-03 ENCOUNTER — Inpatient Hospital Stay: Payer: Self-pay

## 2016-02-03 VITALS — BP 122/68 | HR 68 | Resp 14 | Ht <= 58 in | Wt 168.0 lb

## 2016-02-03 DIAGNOSIS — N6331 Unspecified lump in axillary tail of the right breast: Secondary | ICD-10-CM

## 2016-02-03 NOTE — Progress Notes (Signed)
Patient ID: Emily Kemp, female   DOB: [birth year], 75 y.o.   MRN: [id number]  Chief Complaint  Patient presents with  . Follow-up    HPI Emily Kemp is a 75 y.o. female here today for a evaluation of a reported mass in her right axillary. Area. This was incidentally reported on a chest x-ray obtained by her pulmonologist. She had a x-ray done on 01/02/2016. Last mammogram 07/28/15. The patient has not been aware of any problems in this area. HPI  Past Medical History:  Diagnosis Date  . Arthritis   . Asthma   . Barrett esophagus   . Benign neoplasm of skin of trunk, except scrotum   . Breast screening, unspecified   . COPD (chronic obstructive pulmonary disease) ([place])   . Cystitis   . Degenerative disc disease, lumbar   . GERD (gastroesophageal reflux disease)   . Hypertension   . Lumbar herniated disc   . Morbid obesity ([place])   . Osteoporosis   . Psoriasis   . Scoliosis   . Screening for obesity   . Sleep apnea   . Torn meniscus 2014   right knee    Past Surgical History:  Procedure Laterality Date  . ABDOMINAL HYSTERECTOMY  1996  . BREAST BIOPSY Bilateral 1998   neg  . carotid artery surgery  12/2013  . COLONOSCOPY  2009, 2013   Dr. Dionne Milo  . FOOT SURGERY Left   . SALPINGOOPHORECTOMY  1996  . TONSILLECTOMY N/A   . TOTAL ABDOMINAL HYSTERECTOMY N/A   . UPPER GI ENDOSCOPY  2012    Family History  Problem Relation Age of Onset  . Breast cancer Maternal Aunt 60  . Diabetes Maternal Aunt   . Breast cancer Paternal Aunt 82  . Diabetes Paternal Aunt   . Arthritis Mother   . COPD Mother   . Diabetes Mother   . Heart disease Mother   . Hypertension Father   . Alcohol abuse Father   . Depression Father   . Early death Father   . Heart disease Father   . Mental illness Father   . Stroke Father   . Cancer Sister   . Alcohol abuse Brother   . Heart disease Brother   . Early death Paternal Uncle   . Hypertension Paternal Uncle   . Asthma Paternal  Grandmother     Social History Social History  Substance Use Topics  . Smoking status: Former Smoker    Quit date: 02/22/1990  . Smokeless tobacco: Never Used  . Alcohol use 0.0 oz/week    Allergies  Allergen Reactions  . Benztropine Other (See Comments)  . Catapres  [Clonidine Hcl]     Other reaction(s): Pruritic rash from adhesive  . Lidocaine     Other reaction(s): Pruritic rash from adhesive  . Lithium     Other reaction(s): Altered mental status (finding)  . Wellbutrin [Bupropion] Hives and Other (See Comments)    rash  . Bacitracin-Polymyxin B Rash  . Latex Rash and Swelling    Other reaction(s): Weal  . Paxil [Paroxetine Hcl] Rash and Other (See Comments)    rash  . Sulfa Antibiotics Rash and Other (See Comments)    rash  . Tape Rash    Current Outpatient Prescriptions  Medication Sig Dispense Refill  . amLODipine-valsartan (EXFORGE) 10-320 MG per tablet Take 1 tablet by mouth 2 (two) times daily. 1/2 in the morning and 1/2 in the evening    . aspirin 325 MG  tablet Take 325 mg by mouth daily.    Marland Kitchen atorvastatin (LIPITOR) 20 MG tablet Take 20 mg by mouth daily.    . Benzonatate (TESSALON PERLES PO) Take by mouth. Pt doesn't know dose  Takes as needed    . cloNIDine (CATAPRES) 0.2 MG tablet Take 0.2 mg by mouth 2 (two) times daily.   0  . conjugated estrogens (PREMARIN) vaginal cream Place 1 Applicatorful vaginally as needed.     . diclofenac sodium (VOLTAREN) 1 % GEL I a maximum of 4 g per application to painful area of skin 4 times per day if tolerated 500 g 2  . DULoxetine (CYMBALTA) 60 MG capsule Take 60 mg by mouth 2 (two) times daily.     . fexofenadine (ALLEGRA) 180 MG tablet Take 180 mg by mouth daily. Reported on 07/31/2015    . fluocinonide cream (LIDEX) [id number] % Apply 1 application topically as needed. Reported on 04/16/2015    . fluticasone (VERAMYST) 27.5 MCG/SPRAY nasal spray Place 1 spray into the nose daily. Reported on 07/03/2015    . fluticasone  furoate-vilanterol (BREO ELLIPTA) 100-25 MCG/INH AEPB Inhale into the lungs.    Marland Kitchen HYDROcodone-acetaminophen (NORCO) 7.5-325 MG tablet Limit 1 tablet by mouth per day or twice a day if tolerated 60 tablet 0  . lamoTRIgine (LAMICTAL) 200 MG tablet Take 200 mg by mouth 2 (two) times daily. Reported on 07/31/2015    . LORazepam (ATIVAN) 1 MG tablet Take 1 mg by mouth 3 (three) times daily as needed.     . meloxicam (MOBIC) 7.5 MG tablet Limit 1 tablet by mouth per day or every other day if tolerated 30 tablet 0  . metFORMIN (GLUCOPHAGE) 500 MG tablet Take 1,000 mg by mouth 2 (two) times daily.     . methadone (DOLOPHINE) 5 MG tablet Limit one half tab by mouth per day or every 12 hours if tolerated 30 tablet 0  . methocarbamol (ROBAXIN) 500 MG tablet Limit 1-3 tablets by mouth per day if tolerated 90 tablet 0  . metoprolol (LOPRESSOR) 50 MG tablet Take 50 mg by mouth 2 (two) times daily.    . pantoprazole (PROTONIX) 40 MG tablet Take 40 mg by mouth daily.    . pramipexole (MIRAPEX) 0.25 MG tablet Take 1.5 mg by mouth daily. Reported on 07/31/2015    . pramipexole (MIRAPEX) 1.5 MG tablet Take 1.5 mg by mouth daily. Reported on 04/16/2015    . pregabalin (LYRICA) 150 MG capsule Limit 1 tab by mouth twice per day if tolerated 60 capsule 0  . traZODone (DESYREL) 100 MG tablet Take 300 mg by mouth at bedtime.     . triamcinolone cream (KENALOG) 0.5 % Apply 1 application topically as needed.    . umeclidinium bromide (INCRUSE ELLIPTA) 62.5 MCG/INH AEPB Inhale 1 puff into the lungs daily.     No current facility-administered medications for this visit.     Review of Systems Review of Systems  Constitutional: Negative.   Respiratory: Negative.   Cardiovascular: Negative.     Blood pressure 122/68, pulse 68, resp. rate 14, height 4\' 10"  (1.473 m), weight 168 lb (76.2 kg).  Physical Exam Physical Exam  Constitutional: She is oriented to person, place, and time. She appears well-developed and  well-nourished.  Eyes: Conjunctivae are normal. No scleral icterus.  Neck: Neck supple.  Cardiovascular: Normal rate, regular rhythm and normal heart sounds.   Pulmonary/Chest: Effort normal and breath sounds normal.    Lymphadenopathy:    She  has no cervical adenopathy.       Right cervical: No superficial cervical, no deep cervical and no posterior cervical adenopathy present.   She has axillary adenopathy.       Right: No supraclavicular adenopathy present.  Neurological: She is alert and oriented to person, place, and time.  Skin: Skin is warm and dry.    Data Reviewed Review of the chest x-ray report dated 01/02/2016 requested by her pulmonologist reported in part: several presumed loose bodies overlying the medial aspect of the right glenohumeral joint. These films were not available for review.  Ultrasound examination of the right axilla was unremarkable. No images, no charge.  Assessment    Benign axillary exam, radiologic findings likely related to the right shoulder joint.    Plan     The patient will return next year for her annual breast exam per her request.  This information has been scribed by Gaspar Cola CMA.   Robert Bellow 02/03/2016, 6:55 PM

## 2016-02-03 NOTE — Patient Instructions (Signed)
Return as scheduked

## 2016-03-30 ENCOUNTER — Other Ambulatory Visit: Payer: Self-pay | Admitting: Pain Medicine

## 2016-06-03 ENCOUNTER — Other Ambulatory Visit: Payer: Self-pay

## 2016-06-03 ENCOUNTER — Other Ambulatory Visit: Payer: Self-pay | Admitting: Student

## 2016-06-03 DIAGNOSIS — M4722 Other spondylosis with radiculopathy, cervical region: Secondary | ICD-10-CM

## 2016-06-03 DIAGNOSIS — Z1231 Encounter for screening mammogram for malignant neoplasm of breast: Secondary | ICD-10-CM

## 2016-06-14 ENCOUNTER — Ambulatory Visit
Admission: RE | Admit: 2016-06-14 | Discharge: 2016-06-14 | Disposition: A | Discharge status: Home / Self Care | Payer: Medicare Other | Source: Ambulatory Visit | Attending: Student | Admitting: Student

## 2016-06-14 DIAGNOSIS — M4312 Spondylolisthesis, cervical region: Secondary | ICD-10-CM | POA: Diagnosis not present

## 2016-06-14 DIAGNOSIS — M4722 Other spondylosis with radiculopathy, cervical region: Secondary | ICD-10-CM | POA: Diagnosis present

## 2016-06-14 DIAGNOSIS — M5382 Other specified dorsopathies, cervical region: Secondary | ICD-10-CM | POA: Diagnosis not present

## 2016-06-14 DIAGNOSIS — M4802 Spinal stenosis, cervical region: Secondary | ICD-10-CM | POA: Diagnosis not present

## 2016-06-14 DIAGNOSIS — M47812 Spondylosis without myelopathy or radiculopathy, cervical region: Secondary | ICD-10-CM | POA: Diagnosis not present

## 2016-06-14 DIAGNOSIS — M2578 Osteophyte, vertebrae: Secondary | ICD-10-CM | POA: Insufficient documentation

## 2016-08-04 ENCOUNTER — Ambulatory Visit: Payer: Medicare Other | Admitting: General Surgery

## 2016-08-20 ENCOUNTER — Other Ambulatory Visit (INDEPENDENT_AMBULATORY_CARE_PROVIDER_SITE_OTHER): Payer: Self-pay | Admitting: Vascular Surgery

## 2016-08-20 DIAGNOSIS — I779 Disorder of arteries and arterioles, unspecified: Secondary | ICD-10-CM

## 2016-08-20 DIAGNOSIS — I739 Peripheral vascular disease, unspecified: Principal | ICD-10-CM

## 2016-08-30 ENCOUNTER — Ambulatory Visit (INDEPENDENT_AMBULATORY_CARE_PROVIDER_SITE_OTHER): Payer: Medicare Other | Admitting: Vascular Surgery

## 2016-08-30 ENCOUNTER — Other Ambulatory Visit: Payer: Self-pay | Admitting: Student

## 2016-08-30 ENCOUNTER — Encounter (INDEPENDENT_AMBULATORY_CARE_PROVIDER_SITE_OTHER): Payer: Medicare Other

## 2016-08-30 DIAGNOSIS — M19011 Primary osteoarthritis, right shoulder: Secondary | ICD-10-CM

## 2016-09-06 ENCOUNTER — Ambulatory Visit
Admission: RE | Admit: 2016-09-06 | Discharge: 2016-09-06 | Disposition: A | Discharge status: Home / Self Care | Payer: Medicare Other | Source: Ambulatory Visit | Attending: Student | Admitting: Student

## 2016-09-06 DIAGNOSIS — M7551 Bursitis of right shoulder: Secondary | ICD-10-CM | POA: Diagnosis not present

## 2016-09-06 DIAGNOSIS — M659 Synovitis and tenosynovitis, unspecified: Secondary | ICD-10-CM | POA: Insufficient documentation

## 2016-09-06 DIAGNOSIS — M19011 Primary osteoarthritis, right shoulder: Secondary | ICD-10-CM | POA: Insufficient documentation

## 2016-09-06 DIAGNOSIS — M7581 Other shoulder lesions, right shoulder: Secondary | ICD-10-CM | POA: Insufficient documentation

## 2016-09-07 ENCOUNTER — Encounter: Payer: Self-pay | Admitting: *Deleted

## 2016-10-01 DIAGNOSIS — M7581 Other shoulder lesions, right shoulder: Secondary | ICD-10-CM | POA: Insufficient documentation

## 2016-10-01 DIAGNOSIS — M4722 Other spondylosis with radiculopathy, cervical region: Secondary | ICD-10-CM | POA: Insufficient documentation

## 2016-10-11 ENCOUNTER — Ambulatory Visit (INDEPENDENT_AMBULATORY_CARE_PROVIDER_SITE_OTHER): Payer: Medicare Other

## 2016-10-11 ENCOUNTER — Ambulatory Visit (INDEPENDENT_AMBULATORY_CARE_PROVIDER_SITE_OTHER): Payer: Medicare Other | Admitting: Vascular Surgery

## 2016-10-11 ENCOUNTER — Encounter (INDEPENDENT_AMBULATORY_CARE_PROVIDER_SITE_OTHER): Payer: Self-pay | Admitting: Vascular Surgery

## 2016-10-11 DIAGNOSIS — I6523 Occlusion and stenosis of bilateral carotid arteries: Secondary | ICD-10-CM | POA: Diagnosis not present

## 2016-10-11 DIAGNOSIS — I10 Essential (primary) hypertension: Secondary | ICD-10-CM | POA: Diagnosis not present

## 2016-10-11 DIAGNOSIS — I779 Disorder of arteries and arterioles, unspecified: Secondary | ICD-10-CM

## 2016-10-11 DIAGNOSIS — I6529 Occlusion and stenosis of unspecified carotid artery: Secondary | ICD-10-CM | POA: Insufficient documentation

## 2016-10-11 DIAGNOSIS — E1142 Type 2 diabetes mellitus with diabetic polyneuropathy: Secondary | ICD-10-CM

## 2016-10-11 DIAGNOSIS — Z794 Long term (current) use of insulin: Secondary | ICD-10-CM | POA: Diagnosis not present

## 2016-10-11 DIAGNOSIS — E782 Mixed hyperlipidemia: Secondary | ICD-10-CM | POA: Diagnosis not present

## 2016-10-11 DIAGNOSIS — E785 Hyperlipidemia, unspecified: Secondary | ICD-10-CM | POA: Insufficient documentation

## 2016-10-11 DIAGNOSIS — K219 Gastro-esophageal reflux disease without esophagitis: Secondary | ICD-10-CM | POA: Diagnosis not present

## 2016-10-11 DIAGNOSIS — I739 Peripheral vascular disease, unspecified: Principal | ICD-10-CM

## 2016-10-11 DIAGNOSIS — E119 Type 2 diabetes mellitus without complications: Secondary | ICD-10-CM | POA: Insufficient documentation

## 2016-10-11 NOTE — Progress Notes (Signed)
MRN : [id number]  Emily Kemp is a 76 y.o. (17-Jan-1941) female who presents with chief complaint of  Chief Complaint  Patient presents with  . ultrasound follow up  .  History of Present Illness: The patient is seen for follow up evaluation of carotid stenosis. The carotid stenosis followed by ultrasound.   The patient denies amaurosis fugax. There is no recent history of TIA symptoms or focal motor deficits. There is no prior documented CVA.  The patient is taking enteric-coated aspirin 81 mg daily.  There is no history of migraine headaches. There is no history of seizures.  The patient has a history of coronary artery disease, no recent episodes of angina or shortness of breath. The patient denies PAD or claudication symptoms. There is a history of hyperlipidemia which is being treated with a statin.    Carotid Duplex done today shows <40% bilaterally.  No change compared to last study in 08/2015.  Current Meds  Medication Sig  . amLODipine-valsartan (EXFORGE) 10-320 MG per tablet Take 1 tablet by mouth 2 (two) times daily. 1/2 in the morning and 1/2 in the evening  . aspirin 325 MG tablet Take 325 mg by mouth daily.  Marland Kitchen atorvastatin (LIPITOR) 20 MG tablet Take 20 mg by mouth daily.  Marland Kitchen azelastine (ASTELIN) 0.1 % nasal spray Place into both nostrils 2 (two) times daily. Use in each nostril as directed  . Benzonatate (TESSALON PERLES PO) Take by mouth. Pt doesn't know dose  Takes as needed  . cloNIDine (CATAPRES) 0.2 MG tablet Take 0.2 mg by mouth 2 (two) times daily.   . diclofenac sodium (VOLTAREN) 1 % GEL I a maximum of 4 g per application to painful area of skin 4 times per day if tolerated  . DULoxetine (CYMBALTA) 60 MG capsule Take 60 mg by mouth 2 (two) times daily.   . fluticasone furoate-vilanterol (BREO ELLIPTA) 100-25 MCG/INH AEPB Inhale into the lungs.  Marland Kitchen HYDROcodone-acetaminophen (NORCO) 7.5-325 MG tablet Limit 1 tablet by mouth per day or twice a day if tolerated   . lamoTRIgine (LAMICTAL) 200 MG tablet Take 200 mg by mouth 2 (two) times daily. Reported on 07/31/2015  . metFORMIN (GLUCOPHAGE) 500 MG tablet Take 1,000 mg by mouth 2 (two) times daily.   . methadone (DOLOPHINE) 5 MG tablet Limit one half tab by mouth per day or every 12 hours if tolerated  . methocarbamol (ROBAXIN) 500 MG tablet Limit 1-3 tablets by mouth per day if tolerated  . metoprolol (LOPRESSOR) 50 MG tablet Take 50 mg by mouth 2 (two) times daily.  . pantoprazole (PROTONIX) 40 MG tablet Take 40 mg by mouth daily.  . pramipexole (MIRAPEX) 0.25 MG tablet Take 0.75 mg by mouth as needed. Reported on 07/31/2015  . pregabalin (LYRICA) 150 MG capsule Limit 1 tab by mouth twice per day if tolerated  . sitaGLIPtin (JANUVIA) 50 MG tablet Take 50 mg by mouth daily.  . traZODone (DESYREL) 100 MG tablet Take 300 mg by mouth at bedtime.   Marland Kitchen umeclidinium bromide (INCRUSE ELLIPTA) 62.5 MCG/INH AEPB Inhale 1 puff into the lungs daily.    Past Medical History:  Diagnosis Date  . Arthritis   . Asthma   . Barrett esophagus   . Benign neoplasm of skin of trunk, except scrotum   . Breast screening, unspecified   . COPD (chronic obstructive pulmonary disease) ([place])   . Cystitis   . Degenerative disc disease, lumbar   . GERD (gastroesophageal reflux disease)   .  Hypertension   . Lumbar herniated disc   . Morbid obesity ([place])   . Osteoporosis   . Psoriasis   . Scoliosis   . Screening for obesity   . Sleep apnea   . Torn meniscus 2014   right knee    Past Surgical History:  Procedure Laterality Date  . ABDOMINAL HYSTERECTOMY  1996  . BREAST BIOPSY Bilateral 1998   neg  . carotid artery surgery  12/2013  . COLONOSCOPY  2009, 2013   Dr. Dionne Milo  . FOOT SURGERY Left   . SALPINGOOPHORECTOMY  1996  . TONSILLECTOMY N/A   . TOTAL ABDOMINAL HYSTERECTOMY N/A   . UPPER GI ENDOSCOPY  2012    Social History Social History  Substance Use Topics  . Smoking status: Former Smoker    Quit date:  02/22/1990  . Smokeless tobacco: Never Used  . Alcohol use 0.0 oz/week    Family History Family History  Problem Relation Age of Onset  . Breast cancer Maternal Aunt 5  . Diabetes Maternal Aunt   . Breast cancer Paternal Aunt 109  . Diabetes Paternal Aunt   . Arthritis Mother   . COPD Mother   . Diabetes Mother   . Heart disease Mother   . Hypertension Father   . Alcohol abuse Father   . Depression Father   . Early death Father   . Heart disease Father   . Mental illness Father   . Stroke Father   . Cancer Sister   . Alcohol abuse Brother   . Heart disease Brother   . Early death Paternal Uncle   . Hypertension Paternal Uncle   . Asthma Paternal Grandmother     Allergies  Allergen Reactions  . Benztropine Other (See Comments)  . Catapres  [Clonidine Hcl]     Other reaction(s): Pruritic rash from adhesive  . Lidocaine     Other reaction(s): Pruritic rash from adhesive  . Lithium     Other reaction(s): Altered mental status (finding)  . Wellbutrin [Bupropion] Hives and Other (See Comments)    rash  . Bacitracin-Polymyxin B Rash  . Latex Rash and Swelling    Other reaction(s): Weal  . Paxil [Paroxetine Hcl] Rash and Other (See Comments)    rash  . Sulfa Antibiotics Rash and Other (See Comments)    rash  . Tape Rash     REVIEW OF SYSTEMS (Negative unless checked)  Constitutional: [] Weight loss  [] Fever  [] Chills Cardiac: [] Chest pain   [] Chest pressure   [] Palpitations   [] Shortness of breath when laying flat   [] Shortness of breath with exertion. Vascular:  [] Pain in legs with walking   [] Pain in legs at rest  [] History of DVT   [] Phlebitis   [] Swelling in legs   [] Varicose veins   [] Non-healing ulcers Pulmonary:   [] Uses home oxygen   [] Productive cough   [] Hemoptysis   [] Wheeze  [] COPD   [] Asthma Neurologic:  [] Dizziness   [] Seizures   [] History of stroke   [] History of TIA  [] Aphasia   [] Vissual changes   [x] Weakness or numbness in arm   [x] Weakness or  numbness in leg Musculoskeletal:   [] Joint swelling   [x] Joint pain   [x] Low back pain Hematologic:  [] Easy bruising  [] Easy bleeding   [] Hypercoagulable state   [] Anemic Gastrointestinal:  [] Diarrhea   [] Vomiting  [] Gastroesophageal reflux/heartburn   [] Difficulty swallowing. Genitourinary:  [] Chronic kidney disease   [] Difficult urination  [] Frequent urination   [] Blood in urine Skin:  []   Rashes   [] Ulcers  Psychological:  [] History of anxiety   []  History of major depression.  Physical Examination  Vitals:   10/11/16 1133  BP: (!) 97/57  Pulse: (!) 55  Resp: 16  Weight: 142 lb (64.4 kg)   Body mass index is 29.68 kg/m. Gen: Debilitated presents in a whelechair with a neck collar, NAD Head: [place]/AT, No temporalis wasting.  Ear/Nose/Throat: Hearing grossly intact, nares w/o erythema or drainage Eyes: PER, EOMI, sclera nonicteric.  Neck: Supple, no large masses.   Pulmonary:  Good air movement, no audible wheezing bilaterally, no use of accessory muscles.  Cardiac: RRR, no JVD Vascular:  Vessel Right Left  Radial Palpable Palpable  Brachial Palpable Palpable  Gastrointestinal: Non-distended. No guarding/no peritoneal signs.  Musculoskeletal: M/S 5/5 throughout.  No deformity or atrophy.  Neurologic: CN 2-12 intact. Symmetrical.  Speech is fluent. Motor exam as listed above. Psychiatric: Judgment intact, Mood & affect appropriate for pt's clinical situation. Dermatologic: No rashes or ulcers noted.  No changes consistent with cellulitis. Lymph : No lichenification or skin changes of chronic lymphedema.  CBC Lab Results  Component Value Date   WBC 16.0 (H) 12/29/2013   HGB 10.1 (L) 12/29/2013   HCT 31.4 (L) 12/29/2013   MCV 94 12/29/2013   PLT 324 12/29/2013    BMET    Component Value Date/Time   NA 138 12/29/2013 0658   K 4.3 12/29/2013 0658   CL 106 12/29/2013 0658   CO2 23 12/29/2013 0658   GLUCOSE 199 (H) 12/29/2013 0658   BUN 13 12/29/2013 0658   CREATININE  0.72 12/29/2013 0658   CALCIUM 8.1 (L) 12/29/2013 0658   GFRNONAA >60 12/29/2013 0658   GFRNONAA >60 01/28/2013 2157   GFRAA >60 12/29/2013 0658   GFRAA >60 01/28/2013 2157   CrCl cannot be calculated (Patient's most recent lab result is older than the maximum 21 days allowed.).  COAG Lab Results  Component Value Date   INR 1.0 12/29/2013   INR 0.9 12/19/2013   INR 0.8 09/15/2011    Radiology No results found.   Assessment/Plan 1. Bilateral carotid artery stenosis Recommend:  Given the patient's asymptomatic subcritical stenosis no further invasive testing or surgery at this time.  Continue antiplatelet therapy as prescribed Continue management of CAD, HTN and Hyperlipidemia Healthy heart diet,  encouraged exercise at least 4 times per week Follow up in 24 months with duplex ultrasound and physical exam based on <50% stenosis of the bilateral carotid arteries  - VAS US CAROTID; Future  2. Type 2 diabetes mellitus with diabetic polyneuropathy, with long-term current use of insulin (HCC) Continue hypoglycemic medications as already ordered, these medications have been reviewed and there are no changes at this time.  Hgb A1C to be monitored as already arranged by primary service   3. Essential hypertension Continue antihypertensive medications as already ordered, these medications have been reviewed and there are no changes at this time.   4. Mixed hyperlipidemia Continue statin as ordered and reviewed, no changes at this time   5. Gastroesophageal reflux disease without esophagitis Continue PPI as already ordered, these medications have been reviewed and there are no changes at this time.    Hortencia Pilar, MD  10/11/2016 9:42 PM

## 2016-11-01 ENCOUNTER — Telehealth: Payer: Self-pay | Admitting: General Surgery

## 2016-11-01 NOTE — Telephone Encounter (Signed)
PATIENT WAS SCHEDULED FOR MAMMOGRAM & RETURN APPOINTMENT BACK IN June.SHE HAS HURT HER SHOULDER AND ISN'T A CANDIDATE FOR SURGERY AND UNABLE TO HAVE MAMMO.WOULD YOU LIKE [place] TO MAKE HER A FOLLOW-UP JUST FOR AN OFFICE VISIT.SHE ALSO THINKS SHE HAS AN UMBILICAL HERNIA.THIS HAS NOT BEEN DIAGNOSED BY MEDICAL PERSONAL. CAN WE SEE HER FOR THIS? PLEASE ADVISE ON BOTH.

## 2016-11-02 NOTE — Telephone Encounter (Signed)
Can see without mammogram if she desires. Will assess hernia at the same time.

## 2016-11-18 ENCOUNTER — Ambulatory Visit (INDEPENDENT_AMBULATORY_CARE_PROVIDER_SITE_OTHER): Payer: Medicare Other | Admitting: General Surgery

## 2016-11-18 ENCOUNTER — Ambulatory Visit: Payer: Medicare Other | Admitting: General Surgery

## 2016-11-18 VITALS — BP 130/70 | HR 73 | Resp 12 | Ht <= 58 in | Wt 146.0 lb

## 2016-11-18 DIAGNOSIS — K429 Umbilical hernia without obstruction or gangrene: Secondary | ICD-10-CM

## 2016-11-18 DIAGNOSIS — Z1231 Encounter for screening mammogram for malignant neoplasm of breast: Secondary | ICD-10-CM

## 2016-11-18 DIAGNOSIS — I6523 Occlusion and stenosis of bilateral carotid arteries: Secondary | ICD-10-CM | POA: Diagnosis not present

## 2016-11-18 DIAGNOSIS — Z1239 Encounter for other screening for malignant neoplasm of breast: Secondary | ICD-10-CM

## 2016-11-18 NOTE — Patient Instructions (Addendum)
Patient to return in one year breast check. The patient is aware to call back for any questions or concerns.  Umbilical Hernia, Adult A hernia is a bulge of tissue that pushes through an opening between muscles. An umbilical hernia happens in the abdomen, near the belly button (umbilicus). The hernia may contain tissues from the small intestine, large intestine, or fatty tissue covering the intestines (omentum). Umbilical hernias in adults tend to get worse over time, and they require surgical treatment. There are several types of umbilical hernias. You may have:  A hernia located just above or below the umbilicus (indirect hernia). This is the most common type of umbilical hernia in adults.  A hernia that forms through an opening formed by the umbilicus (direct hernia).  A hernia that comes and goes (reducible hernia). A reducible hernia may be visible only when you strain, lift something heavy, or cough. This type of hernia can be pushed back into the abdomen (reduced).  A hernia that traps abdominal tissue inside the hernia (incarcerated hernia). This type of hernia cannot be reduced.  A hernia that cuts off blood flow to the tissues inside the hernia (strangulated hernia). The tissues can start to die if this happens. This type of hernia requires emergency treatment.  What are the causes? An umbilical hernia happens when tissue inside the abdomen presses on a weak area of the abdominal muscles. What increases the risk? You may have a greater risk of this condition if you:  Are obese.  Have had several pregnancies.  Have a buildup of fluid inside your abdomen (ascites).  Have had surgery that weakens the abdominal muscles.  What are the signs or symptoms? The main symptom of this condition is a painless bulge at or near the belly button. A reducible hernia may be visible only when you strain, lift something heavy, or cough. Other symptoms may include:  Dull pain.  A feeling of  pressure.  Symptoms of a strangulated hernia may include:  Pain that gets increasingly worse.  Nausea and vomiting.  Pain when pressing on the hernia.  Skin over the hernia becoming red or purple.  Constipation.  Blood in the stool.  How is this diagnosed? This condition may be diagnosed based on:  A physical exam. You may be asked to cough or strain while standing. These actions increase the pressure inside your abdomen and force the hernia through the opening in your muscles. Your health care provider may try to reduce the hernia by pressing on it.  Your symptoms and medical history.  How is this treated? Surgery is the only treatment for an umbilical hernia. Surgery for a strangulated hernia is done as soon as possible. If you have a small hernia that is not incarcerated, you may need to lose weight before having surgery. Follow these instructions at home:  Lose weight, if told by your health care provider.  Do not try to push the hernia back in.  Watch your hernia for any changes in color or size. Tell your health care provider if any changes occur.  You may need to avoid activities that increase pressure on your hernia.  Do not lift anything that is heavier than 10 lb (4.5 kg) until your health care provider says that this is safe.  Take over-the-counter and prescription medicines only as told by your health care provider.  Keep all follow-up visits as told by your health care provider. This is important. Contact a health care provider if:  Your  hernia gets larger.  Your hernia becomes painful. Get help right away if:  You develop sudden, severe pain near the area of your hernia.  You have pain as well as nausea or vomiting.  You have pain and the skin over your hernia changes color.  You develop a fever. This information is not intended to replace advice given to you by your health care provider. Make sure you discuss any questions you have with your health  care provider. Document Released: 07/11/2015 Document Revised: 10/12/2015 Document Reviewed: 07/11/2015 Elsevier Interactive Patient Education  [employer].

## 2016-11-18 NOTE — Progress Notes (Signed)
Dictation #1 [id number]  [id number] Patient ID: Emily Kemp, female   DOB: May 02, 1940, 76 y.o.   MRN: [id number]  Chief Complaint  Patient presents with  . Follow-up    HPI Emily Kemp is a 76 y.o. female here today for her one year breast follow. Patient didn't have her mammogram done because of her right shoulder. Patient noticed a umbilical hernia over 10 years now. She states the area has got bigger over the last two years.  Husband, Fritz Pickerel is present at visit.   The patient has had significant difficulty with her right shoulder in had seen Elenore Rota, M.D. from orthopedics who recommended against shoulder replacement unless done at a university setting.  Patient's unable to bring her arm up over about 30 of abduction. She reports about a two-month improvement after steroid injections.   HPI  Past Medical History:  Diagnosis Date  . Arthritis   . Asthma   . Barrett esophagus   . Benign neoplasm of skin of trunk, except scrotum   . Breast screening, unspecified   . COPD (chronic obstructive pulmonary disease) ([place])   . Cystitis   . Degenerative disc disease, lumbar   . GERD (gastroesophageal reflux disease)   . Hypertension   . Lumbar herniated disc   . Morbid obesity ([place])   . Osteoporosis   . Psoriasis   . Scoliosis   . Screening for obesity   . Sleep apnea   . Torn meniscus 2014   right knee    Past Surgical History:  Procedure Laterality Date  . ABDOMINAL HYSTERECTOMY  1996  . BREAST BIOPSY Bilateral 1998   neg  . carotid artery surgery  12/2013  . COLONOSCOPY  2009, 2013   Dr. Dionne Milo  . FOOT SURGERY Left   . SALPINGOOPHORECTOMY  1996  . TONSILLECTOMY N/A   . TOTAL ABDOMINAL HYSTERECTOMY N/A   . UPPER GI ENDOSCOPY  2012    Family History  Problem Relation Age of Onset  . Breast cancer Maternal Aunt 65  . Diabetes Maternal Aunt   . Breast cancer Paternal Aunt 70  . Diabetes Paternal Aunt   . Arthritis Mother   . COPD Mother   .  Diabetes Mother   . Heart disease Mother   . Hypertension Father   . Alcohol abuse Father   . Depression Father   . Early death Father   . Heart disease Father   . Mental illness Father   . Stroke Father   . Cancer Sister   . Alcohol abuse Brother   . Heart disease Brother   . Early death Paternal Uncle   . Hypertension Paternal Uncle   . Asthma Paternal Grandmother     Social History Social History  Substance Use Topics  . Smoking status: Former Smoker    Quit date: 02/22/1990  . Smokeless tobacco: Never Used  . Alcohol use 0.0 oz/week    Allergies  Allergen Reactions  . Benztropine Other (See Comments)  . Catapres  [Clonidine Hcl]     Other reaction(s): Pruritic rash from adhesive  . Lidocaine     Other reaction(s): Pruritic rash from adhesive  . Lithium     Other reaction(s): Altered mental status (finding)  . Wellbutrin [Bupropion] Hives and Other (See Comments)    rash  . Bacitracin-Polymyxin B Rash  . Latex Rash and Swelling    Other reaction(s): Weal  . Paxil [Paroxetine Hcl] Rash and Other (See Comments)    rash  .  Sulfa Antibiotics Rash and Other (See Comments)    rash  . Tape Rash    Current Outpatient Prescriptions  Medication Sig Dispense Refill  . arformoterol (BROVANA) 15 MCG/2ML NEBU Inhale into the lungs.    . budesonide (PULMICORT) 0.5 MG/2ML nebulizer solution Inhale into the lungs.    Marland Kitchen amLODipine-valsartan (EXFORGE) 10-320 MG per tablet Take 1 tablet by mouth 2 (two) times daily. 1/2 in the morning and 1/2 in the evening    . aspirin 325 MG tablet Take 325 mg by mouth daily.    Marland Kitchen atorvastatin (LIPITOR) 20 MG tablet Take 20 mg by mouth daily.    Marland Kitchen azelastine (ASTELIN) 0.1 % nasal spray Place into both nostrils 2 (two) times daily. Use in each nostril as directed    . Benzonatate (TESSALON PERLES PO) Take by mouth. Pt doesn't know dose  Takes as needed    . cloNIDine (CATAPRES) 0.2 MG tablet Take 0.2 mg by mouth 2 (two) times daily.   0  .  conjugated estrogens (PREMARIN) vaginal cream Place 1 Applicatorful vaginally as needed.     . diclofenac sodium (VOLTAREN) 1 % GEL I a maximum of 4 g per application to painful area of skin 4 times per day if tolerated 500 g 2  . DULoxetine (CYMBALTA) 60 MG capsule Take 60 mg by mouth 2 (two) times daily.     . fexofenadine (ALLEGRA) 180 MG tablet Take 180 mg by mouth daily. Reported on 07/31/2015    . fluocinonide cream (LIDEX) 8.93 % Apply 1 application topically as needed. Reported on 04/16/2015    . fluticasone (VERAMYST) 27.5 MCG/SPRAY nasal spray Place 1 spray into the nose daily. Reported on 07/03/2015    . fluticasone furoate-vilanterol (BREO ELLIPTA) 100-25 MCG/INH AEPB Inhale into the lungs.    Marland Kitchen HYDROcodone-acetaminophen (NORCO) 7.5-325 MG tablet Limit 1 tablet by mouth per day or twice a day if tolerated 60 tablet 0  . lamoTRIgine (LAMICTAL) 200 MG tablet Take 200 mg by mouth 2 (two) times daily. Reported on 07/31/2015    . LORazepam (ATIVAN) 1 MG tablet Take 1 mg by mouth 3 (three) times daily as needed.     . meloxicam (MOBIC) 7.5 MG tablet Limit 1 tablet by mouth per day or every other day if tolerated (Patient not taking: Reported on 10/11/2016) 30 tablet 0  . metFORMIN (GLUCOPHAGE) 500 MG tablet Take 1,000 mg by mouth 2 (two) times daily.     . methadone (DOLOPHINE) 5 MG tablet Limit one half tab by mouth per day or every 12 hours if tolerated 30 tablet 0  . methocarbamol (ROBAXIN) 500 MG tablet Limit 1-3 tablets by mouth per day if tolerated 90 tablet 0  . metoprolol (LOPRESSOR) 50 MG tablet Take 50 mg by mouth 2 (two) times daily.    . pantoprazole (PROTONIX) 40 MG tablet Take 40 mg by mouth daily.    . pramipexole (MIRAPEX) 0.25 MG tablet Take 0.75 mg by mouth as needed. Reported on 07/31/2015    . pramipexole (MIRAPEX) 1.5 MG tablet Take 1.5 mg by mouth daily. Reported on 04/16/2015    . pregabalin (LYRICA) 150 MG capsule Limit 1 tab by mouth twice per day if tolerated 60 capsule 0  .  sitaGLIPtin (JANUVIA) 50 MG tablet Take 50 mg by mouth daily.    . traZODone (DESYREL) 100 MG tablet Take 300 mg by mouth at bedtime.     . triamcinolone cream (KENALOG) 0.5 % Apply 1 application topically as  needed.    . umeclidinium bromide (INCRUSE ELLIPTA) 62.5 MCG/INH AEPB Inhale 1 puff into the lungs daily.     No current facility-administered medications for this visit.     Review of Systems Review of Systems  Constitutional: Negative.   Respiratory: Negative.   Cardiovascular: Negative.     Blood pressure 130/70, pulse 73, resp. rate 12, height 4\' 10"  (1.473 m), weight 146 lb (66.2 kg).  Physical Exam Physical Exam  Constitutional: She is oriented to person, place, and time. She appears well-developed and well-nourished.  Cardiovascular: Normal rate, regular rhythm and normal heart sounds.   Pulmonary/Chest: Effort normal and breath sounds normal. Right breast exhibits no inverted nipple, no mass, no nipple discharge, no skin change and no tenderness. Left breast exhibits no inverted nipple, no mass, no nipple discharge, no skin change and no tenderness.  Abdominal: Soft. Normal appearance. A hernia (1cm defect umbilical hernia) is present.    Lymphadenopathy:    She has no cervical adenopathy.    She has no axillary adenopathy.  Neurological: She is alert and oriented to person, place, and time.  Skin: Skin is warm and dry.    Data Reviewed 07/28/2015 mammogram showed calcifications in the right breast able over 2 years. Otherwise unremarkable. BI-RADS-2.  Assessment    Benign breast clinical exam.  Potential candidate for screening mammogram after her next shoulder injection if her shoulder range of motion improves.  Umbilical hernia, enlarging by patient report.    Plan    The umbilical hernia can be repaired if her primary gives approval for a short general anesthetic. Signs/symptoms of obstruction reviewed. Based on its present size, I would anticipate a  primary repair. (Patient reports previous repair in the 36s) . This failed after about 20 years. We get another twenty-year on a primary repair that would be great.    Patient to return in one year breast check. The patient is aware to call back for any questions or concerns.  Hernia precautions and incarceration were discussed with the patient. If they develop symptoms of an incarcerated hernia, they were encouraged to seek prompt medical attention.      HPI, Physical Exam, Assessment and Plan have been scribed under the direction and in the presence of Hervey Ard, MD.  Gaspar Cola, CMA  I have completed the exam and reviewed the above documentation for accuracy and completeness.  I agree with the above.  Haematologist has been used and any errors in dictation or transcription are unintentional.  Hervey Ard, M.D., F.A.C.S. Robert Bellow 11/18/2016, 11:28 AM

## 2017-11-22 ENCOUNTER — Encounter: Payer: Self-pay | Admitting: General Surgery

## 2017-11-22 ENCOUNTER — Ambulatory Visit (INDEPENDENT_AMBULATORY_CARE_PROVIDER_SITE_OTHER): Payer: Medicare Other | Admitting: General Surgery

## 2017-11-22 VITALS — BP 124/68 | HR 72 | Ht 59.0 in | Wt 129.0 lb

## 2017-11-22 DIAGNOSIS — Z9889 Other specified postprocedural states: Secondary | ICD-10-CM

## 2017-11-22 DIAGNOSIS — K429 Umbilical hernia without obstruction or gangrene: Secondary | ICD-10-CM | POA: Diagnosis not present

## 2017-11-22 NOTE — Patient Instructions (Addendum)
Return for one year breast check. The patient is aware to call back for any questions or concerns.

## 2017-11-22 NOTE — Progress Notes (Signed)
Patient ID: Emily Kemp, female   DOB: [birth year], 77 y.o.   MRN: [id number]  Chief Complaint  Patient presents with  . Follow-up    HPI Emily Kemp is a 77 y.o. female here today for her one year follow up breast cancer check up. Patient states she is doing well. Patient doesn't want to have knee or shoulder surgery.   The patient deferred mammograms in 2018 in 2019 while she was working on her shoulder rehab.  HPI  Past Medical History:  Diagnosis Date  . Arthritis   . Asthma   . Barrett esophagus   . Benign neoplasm of skin of trunk, except scrotum   . Breast screening, unspecified   . COPD (chronic obstructive pulmonary disease) ([place])   . Cystitis   . Degenerative disc disease, lumbar   . GERD (gastroesophageal reflux disease)   . Hypertension   . Lumbar herniated disc   . Morbid obesity ([place])   . Osteoporosis   . Psoriasis   . Scoliosis   . Screening for obesity   . Sleep apnea   . Torn meniscus 2014   right knee    Past Surgical History:  Procedure Laterality Date  . ABDOMINAL HYSTERECTOMY  1996  . BREAST BIOPSY Bilateral 1998   neg  . carotid artery surgery  12/2013  . COLONOSCOPY  2009, 2013   Dr. Dionne Milo  . FOOT SURGERY Left   . SALPINGOOPHORECTOMY  1996  . TONSILLECTOMY N/A   . TOTAL ABDOMINAL HYSTERECTOMY N/A   . UPPER GI ENDOSCOPY  2012    Family History  Problem Relation Age of Onset  . Breast cancer Maternal Aunt 64  . Diabetes Maternal Aunt   . Breast cancer Paternal Aunt 15  . Diabetes Paternal Aunt   . Arthritis Mother   . COPD Mother   . Diabetes Mother   . Heart disease Mother   . Hypertension Father   . Alcohol abuse Father   . Depression Father   . Early death Father   . Heart disease Father   . Mental illness Father   . Stroke Father   . Cancer Sister   . Alcohol abuse Brother   . Heart disease Brother   . Early death Paternal Uncle   . Hypertension Paternal Uncle   . Asthma Paternal Grandmother     Social  History Social History   Tobacco Use  . Smoking status: Former Smoker    Last attempt to quit: 02/22/1990    Years since quitting: 27.7  . Smokeless tobacco: Never Used  Substance Use Topics  . Alcohol use: Yes    Alcohol/week: 0.0 standard drinks  . Drug use: No    Allergies  Allergen Reactions  . Benztropine Other (See Comments)  . Catapres  [Clonidine Hcl]     Other reaction(s): Pruritic rash from adhesive  . Lidocaine     Other reaction(s): Pruritic rash from adhesive  . Lithium     Other reaction(s): Altered mental status (finding)  . Wellbutrin [Bupropion] Hives and Other (See Comments)    rash  . Bacitracin-Polymyxin B Rash  . Latex Rash and Swelling    Other reaction(s): Weal  . Paxil [Paroxetine Hcl] Rash and Other (See Comments)    rash  . Sulfa Antibiotics Rash and Other (See Comments)    rash  . Tape Rash    Current Outpatient Medications  Medication Sig Dispense Refill  . amLODipine-valsartan (EXFORGE) 10-320 MG per tablet Take 1 tablet  by mouth 2 (two) times daily. 1/2 in the morning and 1/2 in the evening    . aspirin 325 MG tablet Take 325 mg by mouth daily.    Marland Kitchen atorvastatin (LIPITOR) 20 MG tablet Take 20 mg by mouth daily.    Marland Kitchen azelastine (ASTELIN) 0.1 % nasal spray Place into both nostrils 2 (two) times daily. Use in each nostril as directed    . Benzonatate (TESSALON PERLES PO) Take by mouth. Pt doesn't know dose  Takes as needed    . cloNIDine (CATAPRES) 0.2 MG tablet Take 0.2 mg by mouth 2 (two) times daily.   0  . conjugated estrogens (PREMARIN) vaginal cream Place 1 Applicatorful vaginally as needed.     . diclofenac sodium (VOLTAREN) 1 % GEL I a maximum of 4 g per application to painful area of skin 4 times per day if tolerated 500 g 2  . DULoxetine (CYMBALTA) 60 MG capsule Take 60 mg by mouth 2 (two) times daily.     . fexofenadine (ALLEGRA) 180 MG tablet Take 180 mg by mouth daily. Reported on 07/31/2015    . fluocinonide cream (LIDEX) 1.82 %  Apply 1 application topically as needed. Reported on 04/16/2015    . fluticasone (VERAMYST) 27.5 MCG/SPRAY nasal spray Place 1 spray into the nose daily. Reported on 07/03/2015    . fluticasone furoate-vilanterol (BREO ELLIPTA) 100-25 MCG/INH AEPB Inhale into the lungs.    Marland Kitchen HYDROcodone-acetaminophen (NORCO) 7.5-325 MG tablet Limit 1 tablet by mouth per day or twice a day if tolerated 60 tablet 0  . lamoTRIgine (LAMICTAL) 200 MG tablet Take 200 mg by mouth 2 (two) times daily. Reported on 07/31/2015    . meloxicam (MOBIC) 7.5 MG tablet Limit 1 tablet by mouth per day or every other day if tolerated 30 tablet 0  . metFORMIN (GLUCOPHAGE) 500 MG tablet Take 1,000 mg by mouth 2 (two) times daily.     . methadone (DOLOPHINE) 5 MG tablet Limit one half tab by mouth per day or every 12 hours if tolerated 30 tablet 0  . methocarbamol (ROBAXIN) 500 MG tablet Limit 1-3 tablets by mouth per day if tolerated 90 tablet 0  . metoprolol (LOPRESSOR) 50 MG tablet Take 50 mg by mouth 2 (two) times daily.    . pantoprazole (PROTONIX) 40 MG tablet Take 40 mg by mouth daily.    . traZODone (DESYREL) 100 MG tablet Take 300 mg by mouth at bedtime.     . triamcinolone cream (KENALOG) 0.5 % Apply 1 application topically as needed.    Marland Kitchen arformoterol (BROVANA) 15 MCG/2ML NEBU Inhale into the lungs.    . budesonide (PULMICORT) 0.5 MG/2ML nebulizer solution Inhale into the lungs.     No current facility-administered medications for this visit.     Review of Systems Review of Systems  Constitutional: Negative.   Respiratory: Negative.   Cardiovascular: Negative.     Blood pressure 124/68, pulse 72, height 4\' 11"  (1.499 m), weight 129 lb (58.5 kg).  Physical Exam Physical Exam  Constitutional: She is oriented to person, place, and time. She appears well-developed and well-nourished.  Eyes: Conjunctivae are normal. No scleral icterus.  Neck: Neck supple.  Cardiovascular: Normal rate, regular rhythm and normal heart  sounds.  Pulmonary/Chest: Effort normal and breath sounds normal. Right breast exhibits no inverted nipple, no mass, no nipple discharge, no skin change and no tenderness. Left breast exhibits no inverted nipple, no mass, no nipple discharge, no skin change and no tenderness.  Lymphadenopathy:    She has no cervical adenopathy.    She has no axillary adenopathy.       Right: No supraclavicular adenopathy present.       Left: No supraclavicular adenopathy present.  Neurological: She is alert and oriented to person, place, and time.  Skin: Skin is warm and dry.    Data Reviewed The patient was unable to obtain a mammogram last year due to severe's shoulder dystocia.  At this time, she is going to defer at least for 1 more year as she recovers from her shoulder injury.  Assessment    No clinical evidence of breast pathology.  Right shoulder showing marked improvement in range of motion.    Plan  Return in one year breast check.  The patient is aware to call back for any questions or concerns.  HPI, Physical Exam, Assessment and Plan have been scribed under the direction and in the presence of Robert Bellow, MD. Gaspar Cola CMA.   I have completed the exam and reviewed the above documentation for accuracy and completeness.  I agree with the above.  Haematologist has been used and any errors in dictation or transcription are unintentional.  Hervey Ard, M.D., F.A.C.S.  Emily Kemp 11/23/2017, 8:48 PM

## 2018-01-11 DIAGNOSIS — M4150 Other secondary scoliosis, site unspecified: Secondary | ICD-10-CM | POA: Insufficient documentation

## 2018-01-11 DIAGNOSIS — M4180 Other forms of scoliosis, site unspecified: Secondary | ICD-10-CM | POA: Insufficient documentation

## 2018-03-16 ENCOUNTER — Ambulatory Visit: Payer: Medicare Other | Admitting: Surgery

## 2018-03-17 ENCOUNTER — Encounter: Payer: Self-pay | Admitting: Surgery

## 2018-03-17 ENCOUNTER — Ambulatory Visit (INDEPENDENT_AMBULATORY_CARE_PROVIDER_SITE_OTHER): Payer: Medicare Other | Admitting: Surgery

## 2018-03-17 ENCOUNTER — Other Ambulatory Visit: Payer: Self-pay

## 2018-03-17 VITALS — BP 130/74 | HR 72 | Temp 97.8°F | Ht 60.0 in | Wt 124.0 lb

## 2018-03-17 DIAGNOSIS — N644 Mastodynia: Secondary | ICD-10-CM

## 2018-03-17 DIAGNOSIS — R0789 Other chest pain: Secondary | ICD-10-CM

## 2018-03-17 NOTE — Patient Instructions (Addendum)
Patient to be scheduled for left breast ultrasound. Return after ultrasound with Dr. Bary Castilla The patient is aware to call back for any questions or concerns.

## 2018-03-17 NOTE — Progress Notes (Signed)
03/17/2018  History of Present Illness: Emily Kemp is a 78 y.o. female presenting for evaluation of left chest wall and left breast pain.  She has a history of benign breast biopsy and follows with Dr. Bary Castilla.  She has not been able to get a mammogram since 07/2015 due to right shoulder pain and decreased range of motion.  She needs a right total shoulder replacement and is getting a referral to [facility] for this.  She has been using an ACE wrap that is wrapping around her chest and right shoulder to help support it.  She has noticed that she's now getting left upper chest wall pain that radiates towards her left upper arm and also her left upper outer breast.  The pain is not all the time, and it's only with certain areas of palpation.  She has not noted any masses on that side.  She denies any skin changes, nipple drainage, nipple changes.  Past Medical History: Past Medical History:  Diagnosis Date  . Arthritis   . Asthma   . Barrett esophagus   . Benign neoplasm of skin of trunk, except scrotum   . Breast screening, unspecified   . COPD (chronic obstructive pulmonary disease) ([place])   . Cystitis   . Degenerative disc disease, lumbar   . GERD (gastroesophageal reflux disease)   . Hypertension   . Lumbar herniated disc   . Morbid obesity ([place])   . Osteoporosis   . Psoriasis   . Scoliosis   . Screening for obesity   . Sleep apnea   . Torn meniscus 2014   right knee     Past Surgical History: Past Surgical History:  Procedure Laterality Date  . ABDOMINAL HYSTERECTOMY  1996  . BREAST BIOPSY Bilateral 1998   neg  . carotid artery surgery  12/2013  . COLONOSCOPY  2009, 2013   Dr. Dionne Milo  . FOOT SURGERY Left   . SALPINGOOPHORECTOMY  1996  . TONSILLECTOMY N/A   . TOTAL ABDOMINAL HYSTERECTOMY N/A   . UPPER GI ENDOSCOPY  2012    Home Medications: Prior to Admission medications   Medication Sig Start Date End Date Taking? Authorizing Provider  amLODipine-valsartan  (EXFORGE) 10-320 MG per tablet Take 1 tablet by mouth 2 (two) times daily. 1/2 in the morning and 1/2 in the evening   Yes [provider]  aspirin 325 MG tablet Take 325 mg by mouth daily.   Yes [provider]  atorvastatin (LIPITOR) 20 MG tablet Take 20 mg by mouth daily.   Yes [provider]  azelastine (ASTELIN) 0.1 % nasal spray Place into both nostrils 2 (two) times daily. Use in each nostril as directed   Yes [provider]  Benzonatate (TESSALON PERLES PO) Take by mouth. Pt doesn't know dose  Takes as needed   Yes [provider]  cloNIDine (CATAPRES) 0.2 MG tablet Take 0.2 mg by mouth 2 (two) times daily.  08/14/15  Yes [provider]  conjugated estrogens (PREMARIN) vaginal cream Place 1 Applicatorful vaginally as needed.    Yes [provider]  diclofenac sodium (VOLTAREN) 1 % GEL I a maximum of 4 g per application to painful area of skin 4 times per day if tolerated 10/29/14  Yes Mohammed Kindle, MD  DULoxetine (CYMBALTA) 60 MG capsule Take 60 mg by mouth 2 (two) times daily.    Yes [provider]  fexofenadine (ALLEGRA) 180 MG tablet Take 180 mg by mouth daily. Reported on 07/31/2015  Yes [provider]  fluocinonide cream (LIDEX) 4.23 % Apply 1 application topically as needed. Reported on 04/16/2015   Yes [provider]  fluticasone (VERAMYST) 27.5 MCG/SPRAY nasal spray Place 1 spray into the nose daily. Reported on 07/03/2015   Yes [provider]  fluticasone furoate-vilanterol (BREO ELLIPTA) 100-25 MCG/INH AEPB Inhale into the lungs. 03/27/15  Yes [provider]  HYDROcodone-acetaminophen (NORCO) 7.5-325 MG tablet Limit 1 tablet by mouth per day or twice a day if tolerated 10/29/15  Yes Mohammed Kindle, MD  lamoTRIgine (LAMICTAL) 200 MG tablet Take 200 mg by mouth 2 (two) times daily. Reported on 07/31/2015   Yes [provider]  meloxicam (MOBIC) 7.5 MG tablet Limit 1 tablet  by mouth per day or every other day if tolerated 10/29/15  Yes Mohammed Kindle, MD  metFORMIN (GLUCOPHAGE) 500 MG tablet Take 1,000 mg by mouth 2 (two) times daily.    Yes [provider]  methadone (DOLOPHINE) 5 MG tablet Limit one half tab by mouth per day or every 12 hours if tolerated 10/29/15  Yes Mohammed Kindle, MD  methocarbamol (ROBAXIN) 500 MG tablet Limit 1-3 tablets by mouth per day if tolerated 10/29/15  Yes Mohammed Kindle, MD  metoprolol (LOPRESSOR) 50 MG tablet Take 50 mg by mouth 2 (two) times daily.   Yes [provider]  pantoprazole (PROTONIX) 40 MG tablet Take 40 mg by mouth daily.   Yes [provider]  traZODone (DESYREL) 100 MG tablet Take 300 mg by mouth at bedtime.    Yes [provider]  triamcinolone cream (KENALOG) 0.5 % Apply 1 application topically as needed.   Yes [provider]  arformoterol (BROVANA) 15 MCG/2ML NEBU Inhale into the lungs. 10/18/16 10/18/17  [provider]  budesonide (PULMICORT) 0.5 MG/2ML nebulizer solution Inhale into the lungs. 10/18/16 10/18/17  [provider]    Allergies: Allergies  Allergen Reactions  . Benztropine Other (See Comments)  . Catapres  [Clonidine Hcl]     Other reaction(s): Pruritic rash from adhesive  . Lidocaine     Other reaction(s): Pruritic rash from adhesive  . Lithium     Other reaction(s): Altered mental status (finding)  . Wellbutrin [Bupropion] Hives and Other (See Comments)    rash  . Bacitracin-Polymyxin B Rash  . Latex Rash and Swelling    Other reaction(s): Weal  . Paxil [Paroxetine Hcl] Rash and Other (See Comments)    rash  . Sulfa Antibiotics Rash and Other (See Comments)    rash  . Tape Rash    Review of Systems: Review of Systems  Constitutional: Negative for chills and fever.  Respiratory: Negative for shortness of breath.   Cardiovascular: Negative for chest pain.  Gastrointestinal: Negative for abdominal pain, nausea and vomiting.   Musculoskeletal: Positive for joint pain and myalgias.  Skin: Negative for rash.    Physical Exam BP 130/74   Pulse 72   Temp 97.8 F (36.6 C) (Skin)   Ht 5' (1.524 m)   Wt 124 lb (56.2 kg)   SpO2 97%   BMI 24.22 kg/m  CONSTITUTIONAL: No acute distress HEENT:  Normocephalic, atraumatic, extraocular motion intact. RESPIRATORY:  Lungs are clear, and breath sounds are equal bilaterally. Normal respiratory effort without pathologic use of accessory muscles. CARDIOVASCULAR: Heart is regular without murmurs, gallops, or rubs. BREAST:  Left breast without any palpable masses, skin changes, nipple changes or drainage, and no palpable lymphadenopathy. GI: The abdomen is soft, non-distended, non-tender.  MUSCULOSKELETAL:  No palpable masses over left upper arm, axilla, or left chest wall.  There is tenderness to palpation over the left chest wall and left upper breast which is reproducible and feels more musculoskeletal in nature. PSYCH:  Alert and oriented to person, place and time. Affect is normal.  Labs/Imaging: None  Assessment and Plan: This is a 78 y.o. female with left chest wall and left breast pain.  Discussed with the patient that this may be MSK in nature and possibly due to overcompensating with her left side as her right shoulder is more compromised.  Her husband agrees with this suggestion.  Do not think the ACE wrap would be causing this necessarily, but as a precaution recommended that she uses an ABD pad to help cushion her left chest wall.  There were no palpable masses but given that she has not had a mammogram in 2.5 years, will order a mammogram and ultrasound of the left breast and axilla.  She may not be able to do her right side due to her shoulder.  She will follow up with Dr. Bary Castilla after the imaging studies are done.  Face-to-face time spent with the patient and care providers was 15 minutes, with more than 50% of the time spent counseling, educating, and  coordinating care of the patient.     Melvyn Neth, [facility]

## 2018-03-24 ENCOUNTER — Other Ambulatory Visit: Payer: No Typology Code available for payment source

## 2018-03-30 ENCOUNTER — Ambulatory Visit: Payer: Medicare Other | Admitting: General Surgery

## 2018-04-03 ENCOUNTER — Ambulatory Visit
Admission: RE | Admit: 2018-04-03 | Discharge: 2018-04-03 | Disposition: A | Discharge status: Home / Self Care | Payer: Medicare Other | Source: Ambulatory Visit | Attending: Surgery | Admitting: Surgery

## 2018-04-03 DIAGNOSIS — R0789 Other chest pain: Secondary | ICD-10-CM

## 2018-04-03 DIAGNOSIS — N644 Mastodynia: Secondary | ICD-10-CM | POA: Diagnosis present

## 2018-04-11 ENCOUNTER — Ambulatory Visit: Payer: Medicare Other | Admitting: General Surgery

## 2018-04-20 ENCOUNTER — Encounter: Payer: Self-pay | Admitting: General Surgery

## 2018-04-20 ENCOUNTER — Ambulatory Visit (INDEPENDENT_AMBULATORY_CARE_PROVIDER_SITE_OTHER): Payer: Medicare Other

## 2018-04-20 ENCOUNTER — Ambulatory Visit (INDEPENDENT_AMBULATORY_CARE_PROVIDER_SITE_OTHER): Payer: Medicare Other | Admitting: General Surgery

## 2018-04-20 ENCOUNTER — Other Ambulatory Visit: Payer: Self-pay

## 2018-04-20 VITALS — BP 112/52 | HR 64 | Temp 98.1°F | Ht <= 58 in | Wt 124.0 lb

## 2018-04-20 DIAGNOSIS — N6310 Unspecified lump in the right breast, unspecified quadrant: Secondary | ICD-10-CM

## 2018-04-20 NOTE — Progress Notes (Signed)
Patient ID: Emily Kemp, female   DOB: 07/08/1940, 78 y.o.   MRN: [id number]  Chief Complaint  Patient presents with  . Follow-up    HPI Emily Kemp is a 78 y.o. female who presents for a breast evaluation. The most recent mammogram was done on 04/03/2018 .  Patient does perform regular self breast checks and gets regular mammograms done.    HPI  Past Medical History:  Diagnosis Date  . Arthritis   . Asthma   . Barrett esophagus   . Benign neoplasm of skin of trunk, except scrotum   . Breast screening, unspecified   . COPD (chronic obstructive pulmonary disease) ([place])   . Cystitis   . Degenerative disc disease, lumbar   . GERD (gastroesophageal reflux disease)   . Hypertension   . Lumbar herniated disc   . Morbid obesity ([place])   . Osteoporosis   . Psoriasis   . Scoliosis   . Screening for obesity   . Sleep apnea   . Torn meniscus 2014   right knee    Past Surgical History:  Procedure Laterality Date  . ABDOMINAL HYSTERECTOMY  1996  . BREAST BIOPSY Bilateral 1998   neg  . carotid artery surgery  12/2013  . COLONOSCOPY  2009, 2013   Dr. Dionne Milo  . FOOT SURGERY Left   . SALPINGOOPHORECTOMY  1996  . TONSILLECTOMY N/A   . TOTAL ABDOMINAL HYSTERECTOMY N/A   . UPPER GI ENDOSCOPY  2012    Family History  Problem Relation Age of Onset  . Breast cancer Maternal Aunt 32  . Diabetes Maternal Aunt   . Breast cancer Paternal Aunt 30  . Diabetes Paternal Aunt   . Arthritis Mother   . COPD Mother   . Diabetes Mother   . Heart disease Mother   . Hypertension Father   . Alcohol abuse Father   . Depression Father   . Early death Father   . Heart disease Father   . Mental illness Father   . Stroke Father   . Cancer Sister   . Alcohol abuse Brother   . Heart disease Brother   . Early death Paternal Uncle   . Hypertension Paternal Uncle   . Asthma Paternal Grandmother     Social History Social History   Tobacco Use  . Smoking status: Former Smoker   Last attempt to quit: 02/22/1990    Years since quitting: 28.1  . Smokeless tobacco: Never Used  Substance Use Topics  . Alcohol use: Yes    Alcohol/week: 0.0 standard drinks  . Drug use: No    Allergies  Allergen Reactions  . Benztropine Other (See Comments)  . Catapres  [Clonidine Hcl]     Other reaction(s): Pruritic rash from adhesive  . Lidocaine     Other reaction(s): Pruritic rash from adhesive  . Lithium     Other reaction(s): Altered mental status (finding)  . Wellbutrin [Bupropion] Hives and Other (See Comments)    rash  . Bacitracin-Polymyxin B Rash  . Latex Rash and Swelling    Other reaction(s): Weal  . Paxil [Paroxetine Hcl] Rash and Other (See Comments)    rash  . Sulfa Antibiotics Rash and Other (See Comments)    rash  . Tape Rash    Current Outpatient Medications  Medication Sig Dispense Refill  . amLODipine-valsartan (EXFORGE) 10-320 MG per tablet Take 1 tablet by mouth 2 (two) times daily. 1/2 in the morning and 1/2 in the evening    .  aspirin 325 MG tablet Take 325 mg by mouth daily.    Marland Kitchen atorvastatin (LIPITOR) 20 MG tablet Take 20 mg by mouth daily.    Marland Kitchen azelastine (ASTELIN) 0.1 % nasal spray Place into both nostrils 2 (two) times daily. Use in each nostril as directed    . Benzonatate (TESSALON PERLES PO) Take by mouth. Pt doesn't know dose  Takes as needed    . cloNIDine (CATAPRES) 0.2 MG tablet Take 0.2 mg by mouth 2 (two) times daily.   0  . conjugated estrogens (PREMARIN) vaginal cream Place 1 Applicatorful vaginally as needed.     . diclofenac sodium (VOLTAREN) 1 % GEL I a maximum of 4 g per application to painful area of skin 4 times per day if tolerated 500 g 2  . DULoxetine (CYMBALTA) 60 MG capsule Take 60 mg by mouth 2 (two) times daily.     . fexofenadine (ALLEGRA) 180 MG tablet Take 180 mg by mouth daily. Reported on 07/31/2015    . fluocinonide cream (LIDEX) 1.76 % Apply 1 application topically as needed. Reported on 04/16/2015    . fluticasone  (VERAMYST) 27.5 MCG/SPRAY nasal spray Place 1 spray into the nose daily. Reported on 07/03/2015    . fluticasone furoate-vilanterol (BREO ELLIPTA) 100-25 MCG/INH AEPB Inhale into the lungs.    Marland Kitchen HYDROcodone-acetaminophen (NORCO) 7.5-325 MG tablet Limit 1 tablet by mouth per day or twice a day if tolerated 60 tablet 0  . lamoTRIgine (LAMICTAL) 200 MG tablet Take 200 mg by mouth 2 (two) times daily. Reported on 07/31/2015    . meloxicam (MOBIC) 7.5 MG tablet Limit 1 tablet by mouth per day or every other day if tolerated 30 tablet 0  . metFORMIN (GLUCOPHAGE) 500 MG tablet Take 1,000 mg by mouth 2 (two) times daily.     . methadone (DOLOPHINE) 5 MG tablet Limit one half tab by mouth per day or every 12 hours if tolerated 30 tablet 0  . methocarbamol (ROBAXIN) 500 MG tablet Limit 1-3 tablets by mouth per day if tolerated 90 tablet 0  . metoprolol (LOPRESSOR) 50 MG tablet Take 50 mg by mouth 2 (two) times daily.    . pantoprazole (PROTONIX) 40 MG tablet Take 40 mg by mouth daily.    . traZODone (DESYREL) 100 MG tablet Take 300 mg by mouth at bedtime.     . triamcinolone cream (KENALOG) 0.5 % Apply 1 application topically as needed.    Marland Kitchen arformoterol (BROVANA) 15 MCG/2ML NEBU Inhale into the lungs.    . budesonide (PULMICORT) 0.5 MG/2ML nebulizer solution Inhale into the lungs.     No current facility-administered medications for this visit.     Review of Systems Review of Systems  Constitutional: Negative.   Respiratory: Negative.   Cardiovascular: Negative.     Blood pressure (!) 112/52, pulse 64, temperature 98.1 F (36.7 C), temperature source Skin, height 4\' 10"  (1.473 m), weight 124 lb (56.2 kg), SpO2 98 %.  Physical Exam Physical Exam Chest:       Data Reviewed Bilateral diagnostic mammogram left breast ultrasound dated April 03, 2018 was reviewed.  No abnormality appreciated.  Ultrasound examination of the right breast was undertaken because of the new palpable lesion.   Identified was a 0.31 x 0.32 x 0.34 cm avascular hypoechoic/anechoic lesion without posterior acoustic enhancement.  This almost abuts the overlying dermis.  It seems to be well separated from the underlying breast parenchyma.  Indeterminant, BI-RADS-3.  Assessment Unremarkable breast exam except for  the tiny palpable nodule which except for the absence of acoustic enhancement is entirely unremarkable.  At this time, I do not believe intervention is required.  We will plan for observation.  Plan  Patient will be asked to return to the office in 6 months for a clinical exam and an office ultrasound. The patient is aware to call back for any questions or concerns.   HPI, Physical Exam, Assessment and Plan have been scribed under the direction and in the presence of Hervey Ard, MD.  Gaspar Cola, CMA  I have completed the exam and reviewed the above documentation for accuracy and completeness.  I agree with the above.  Haematologist has been used and any errors in dictation or transcription are unintentional.  Hervey Ard, M.D., F.A.C.S.  Forest Gleason Amiracle Neises 04/21/2018, 8:08 AM

## 2018-04-20 NOTE — Patient Instructions (Signed)
Patient will be asked to return to the office in one year with a bilateral screening mammogram. The patient is aware to call back for any questions or concerns. 

## 2018-09-12 ENCOUNTER — Encounter: Payer: Self-pay | Admitting: General Surgery

## 2018-09-20 DIAGNOSIS — R2689 Other abnormalities of gait and mobility: Secondary | ICD-10-CM | POA: Insufficient documentation

## 2018-09-20 DIAGNOSIS — G479 Sleep disorder, unspecified: Secondary | ICD-10-CM | POA: Insufficient documentation

## 2018-10-09 ENCOUNTER — Encounter (INDEPENDENT_AMBULATORY_CARE_PROVIDER_SITE_OTHER): Payer: Medicare Other

## 2018-10-09 ENCOUNTER — Ambulatory Visit (INDEPENDENT_AMBULATORY_CARE_PROVIDER_SITE_OTHER): Payer: Medicare Other | Admitting: Vascular Surgery

## 2018-10-19 ENCOUNTER — Ambulatory Visit: Payer: Medicare Other | Admitting: General Surgery

## 2018-12-13 ENCOUNTER — Encounter: Payer: Self-pay | Admitting: *Deleted

## 2019-12-24 ENCOUNTER — Other Ambulatory Visit: Payer: Self-pay | Admitting: Neurology

## 2019-12-24 DIAGNOSIS — R2990 Unspecified symptoms and signs involving the nervous system: Secondary | ICD-10-CM

## 2019-12-29 ENCOUNTER — Ambulatory Visit
Admission: RE | Admit: 2019-12-29 | Discharge: 2019-12-29 | Disposition: A | Discharge status: Home / Self Care | Payer: Medicare Other | Source: Ambulatory Visit | Attending: Neurology | Admitting: Neurology

## 2019-12-29 DIAGNOSIS — R2990 Unspecified symptoms and signs involving the nervous system: Secondary | ICD-10-CM | POA: Diagnosis present

## 2020-01-31 ENCOUNTER — Other Ambulatory Visit: Payer: Self-pay

## 2020-01-31 ENCOUNTER — Encounter (INDEPENDENT_AMBULATORY_CARE_PROVIDER_SITE_OTHER): Payer: Self-pay | Admitting: Vascular Surgery

## 2020-01-31 ENCOUNTER — Ambulatory Visit (INDEPENDENT_AMBULATORY_CARE_PROVIDER_SITE_OTHER): Payer: Medicare Other | Admitting: Vascular Surgery

## 2020-01-31 VITALS — BP 130/83 | HR 67 | Ht <= 58 in | Wt 130.0 lb

## 2020-01-31 DIAGNOSIS — I6523 Occlusion and stenosis of bilateral carotid arteries: Secondary | ICD-10-CM

## 2020-01-31 DIAGNOSIS — K219 Gastro-esophageal reflux disease without esophagitis: Secondary | ICD-10-CM

## 2020-01-31 DIAGNOSIS — J449 Chronic obstructive pulmonary disease, unspecified: Secondary | ICD-10-CM | POA: Diagnosis not present

## 2020-01-31 DIAGNOSIS — E782 Mixed hyperlipidemia: Secondary | ICD-10-CM | POA: Diagnosis not present

## 2020-01-31 DIAGNOSIS — I10 Essential (primary) hypertension: Secondary | ICD-10-CM

## 2020-01-31 NOTE — Progress Notes (Signed)
MRN : [id number]  Emily Kemp is a 79 y.o. (03-02-1940) female who presents with chief complaint of  Chief Complaint  Patient presents with  . New Patient (Initial Visit)    Hx of carotid stenosis in 2018   .  History of Present Illness:   The patient is seen for follow up evaluation of carotid stenosis. The patient has been diagnosed with progressive white matter disease.  The patient denies amaurosis fugax. There is no recent history of TIA symptoms or focal motor deficits. There is no prior documented CVA.  There is no history of migraine headaches. There is no history of seizures.  The patient has a history of coronary artery disease, no recent episodes of angina or shortness of breath. The patient denies PAD or claudication symptoms. There is a history of hyperlipidemia which is being treated with a statin.   Carotid Duplex done today shows <40% bilaterally.    No outpatient medications have been marked as taking for the 01/31/20 encounter (Office Visit) with Delana Meyer, Dolores Lory, MD.    Past Medical History:  Diagnosis Date  . Arthritis   . Asthma   . Barrett esophagus   . Benign neoplasm of skin of trunk, except scrotum   . Breast screening, unspecified   . COPD (chronic obstructive pulmonary disease) ([place])   . Cystitis   . Degenerative disc disease, lumbar   . GERD (gastroesophageal reflux disease)   . Hypertension   . Lumbar herniated disc   . Morbid obesity ([place])   . Osteoporosis   . Psoriasis   . Scoliosis   . Screening for obesity   . Sleep apnea   . Torn meniscus 2014   right knee    Past Surgical History:  Procedure Laterality Date  . ABDOMINAL HYSTERECTOMY  1996  . BREAST BIOPSY Bilateral 1998   neg  . carotid artery surgery  12/2013  . COLONOSCOPY  2009, 2013   Dr. Dionne Milo  . FOOT SURGERY Left   . SALPINGOOPHORECTOMY  1996  . TONSILLECTOMY N/A   . TOTAL ABDOMINAL HYSTERECTOMY N/A   . UPPER GI ENDOSCOPY  2012    Social  History Social History   Tobacco Use  . Smoking status: Former Smoker    Quit date: 02/22/1990    Years since quitting: 29.9  . Smokeless tobacco: Never Used  Substance Use Topics  . Alcohol use: Yes    Alcohol/week: 0.0 standard drinks  . Drug use: No    Family History Family History  Problem Relation Age of Onset  . Breast cancer Maternal Aunt 25  . Diabetes Maternal Aunt   . Breast cancer Paternal Aunt 27  . Diabetes Paternal Aunt   . Arthritis Mother   . COPD Mother   . Diabetes Mother   . Heart disease Mother   . Hypertension Father   . Alcohol abuse Father   . Depression Father   . Early death Father   . Heart disease Father   . Mental illness Father   . Stroke Father   . Cancer Sister   . Alcohol abuse Brother   . Heart disease Brother   . Early death Paternal Uncle   . Hypertension Paternal Uncle   . Asthma Paternal Grandmother   No family history of bleeding/clotting disorders, porphyria or autoimmune disease   Allergies  Allergen Reactions  . Benztropine Other (See Comments)  . Catapres  [Clonidine Hcl]     Other reaction(s): Pruritic rash from adhesive  .  Lidocaine     Other reaction(s): Pruritic rash from adhesive  . Lithium     Other reaction(s): Altered mental status (finding)  . Wellbutrin [Bupropion] Hives and Other (See Comments)    rash  . Bacitracin-Polymyxin B Rash  . Latex Rash and Swelling    Other reaction(s): Weal  . Paxil [Paroxetine Hcl] Rash and Other (See Comments)    rash  . Sulfa Antibiotics Rash and Other (See Comments)    rash  . Tape Rash     REVIEW OF SYSTEMS (Negative unless checked)  Constitutional: [] Weight loss  [] Fever  [] Chills Cardiac: [] Chest pain   [] Chest pressure   [] Palpitations   [] Shortness of breath when laying flat   [] Shortness of breath with exertion. Vascular:  [] Pain in legs with walking   [] Pain in legs at rest  [] History of DVT   [] Phlebitis   [] Swelling in legs   [] Varicose veins    [] Non-healing ulcers Pulmonary:   [] Uses home oxygen   [] Productive cough   [] Hemoptysis   [] Wheeze  [] COPD   [] Asthma Neurologic:  [] Dizziness   [] Seizures   [] History of stroke   [] History of TIA  [] Aphasia   [] Vissual changes   [] Weakness or numbness in arm   [x] Weakness or numbness in leg Musculoskeletal:   [] Joint swelling   [x] Joint pain   [] Low back pain Hematologic:  [] Easy bruising  [] Easy bleeding   [] Hypercoagulable state   [] Anemic Gastrointestinal:  [] Diarrhea   [] Vomiting  [x] Gastroesophageal reflux/heartburn   [] Difficulty swallowing. Genitourinary:  [] Chronic kidney disease   [] Difficult urination  [] Frequent urination   [] Blood in urine Skin:  [] Rashes   [] Ulcers  Psychological:  [] History of anxiety   []  History of major depression.  Physical Examination  Vitals:   01/31/20 1156  BP: 130/83  Pulse: 67  Weight: 130 lb (59 kg)  Height: 4\' 10"  (1.473 m)   Body mass index is 27.17 kg/m. Gen: WD/WN, patient in a wheelchair Head: [place]/AT, No temporalis wasting.  Ear/Nose/Throat: Hearing grossly intact, nares w/o erythema or drainage, poor dentition Eyes: PER, EOMI, sclera nonicteric.  Neck: Supple, no masses.  No bruit or JVD.  Pulmonary:  Good air movement, clear to auscultation bilaterally, no use of accessory muscles.  Cardiac: RRR, normal S1, S2, no Murmurs. Vascular:  Vessel Right Left  Radial Palpable Palpable  Gastrointestinal: soft, non-distended. No guarding/no peritoneal signs.  Musculoskeletal: M/S 5/5 throughout.  No deformity or atrophy.  Neurologic: CN 2-12 intact. Pain and light touch intact in extremities.  Symmetrical.  Speech is fluent. Motor exam as listed above. Psychiatric: Judgment intact, Mood & affect appropriate for pt's clinical situation. Dermatologic: No rashes or ulcers noted.  No changes consistent with cellulitis.   CBC Lab Results  Component Value Date   WBC 16.0 (H) 12/29/2013   HGB 10.1 (L) 12/29/2013   HCT 31.4 (L) 12/29/2013    MCV 94 12/29/2013   PLT 324 12/29/2013    BMET    Component Value Date/Time   NA 138 12/29/2013 0658   K 4.3 12/29/2013 0658   CL 106 12/29/2013 0658   CO2 23 12/29/2013 0658   GLUCOSE 199 (H) 12/29/2013 0658   BUN 13 12/29/2013 0658   CREATININE 0.72 12/29/2013 0658   CALCIUM 8.1 (L) 12/29/2013 0658   GFRNONAA >60 12/29/2013 0658   GFRNONAA >60 01/28/2013 2157   GFRAA >60 12/29/2013 0658   GFRAA >60 01/28/2013 2157   CrCl cannot be calculated (Patient's most recent lab result is  older than the maximum 21 days allowed.).  COAG Lab Results  Component Value Date   INR 1.0 12/29/2013   INR 0.9 12/19/2013   INR 0.8 09/15/2011    Radiology No results found.   Assessment/Plan 1. Bilateral carotid artery stenosis The patient has increasing neurologic symptoms.  Patient should undergo CT angiography of the carotid arteries to define the degree of stenosis of the internal carotid arteries bilaterally and the anatomic suitability for surgery vs. intervention.  The risks, benefits and alternative therapies were reviewed in detail with the patient.  All questions were answered.  The patient agrees to proceed with imaging.  Continue antiplatelet therapy as prescribed. Continue management of CAD, HTN and Hyperlipidemia. Healthy heart diet, encouraged exercise at least 4 times per week.   - CT ANGIO NECK W OR WO CONTRAST; Future - CT ANGIO HEAD W OR WO CONTRAST; Future  2. Essential hypertension Continue antihypertensive medications as already ordered, these medications have been reviewed and there are no changes at this time.   3. COPD, moderate ([place]) Continue pulmonary medications and aerosols as already ordered, these medications have been reviewed and there are no changes at this time.    4. Mixed hyperlipidemia Continue statin as ordered and reviewed, no changes at this time   5. Gastroesophageal reflux disease without esophagitis Continue PPI as already ordered,  this medication has been reviewed and there are no changes at this time.  Avoidence of caffeine and alcohol  Moderate elevation of the head of the bed     Hortencia Pilar, MD  01/31/2020 12:02 PM

## 2020-02-19 ENCOUNTER — Ambulatory Visit
Admission: RE | Admit: 2020-02-19 | Discharge: 2020-02-19 | Disposition: A | Discharge status: Home / Self Care | Payer: Medicare Other | Source: Ambulatory Visit | Attending: Vascular Surgery | Admitting: Vascular Surgery

## 2020-02-19 ENCOUNTER — Other Ambulatory Visit: Payer: Self-pay

## 2020-02-19 DIAGNOSIS — I6523 Occlusion and stenosis of bilateral carotid arteries: Secondary | ICD-10-CM | POA: Diagnosis not present

## 2020-02-19 LAB — POCT I-STAT CREATININE: Creatinine, Ser: 0.6 mg/dL (ref 0.44–1.00)

## 2020-02-19 MED ORDER — IOHEXOL 350 MG/ML SOLN
75.0000 mL | Freq: Once | INTRAVENOUS | Status: AC | PRN
  Administered 2020-02-19: 10:00:00 75 mL via INTRAVENOUS

## 2020-02-27 NOTE — Progress Notes (Signed)
MRN : [id number]  Emily Kemp is a 80 y.o. (11/08/1940) female who presents with chief complaint of No chief complaint on file. Marland Kitchen.  History of Present Illness:   The patient is seen for follow up evaluation of carotid stenosis and review of the CT angio neck and intracranial. The patient has been diagnosed with progressive white matter disease.  Family reports no problems or complications from the CT scan.  The patient denies interval amaurosis fugax. There is no recent history of TIA symptoms or interval episodes of focal motor deficits. There is no prior documented CVA.  The patient has a history of coronary artery disease, no recent episodes of angina or shortness of breath. The patient denies PAD or claudication symptoms. There is a history of hyperlipidemia which is being treated with a statin.   Previous carotid duplex done today shows<40% bilaterally.  No outpatient medications have been marked as taking for the 02/28/20 encounter (Appointment) with Gilda CreaseSchnier, Latina CraverGregory G, MD.    Past Medical History:  Diagnosis Date  . Arthritis   . Asthma   . Barrett esophagus   . Benign neoplasm of skin of trunk, except scrotum   . Breast screening, unspecified   . COPD (chronic obstructive pulmonary disease) (HCC)   . Cystitis   . Degenerative disc disease, lumbar   . GERD (gastroesophageal reflux disease)   . Hypertension   . Lumbar herniated disc   . Morbid obesity (HCC)   . Osteoporosis   . Psoriasis   . Scoliosis   . Screening for obesity   . Sleep apnea   . Torn meniscus 2014   right knee    Past Surgical History:  Procedure Laterality Date  . ABDOMINAL HYSTERECTOMY  1996  . BREAST BIOPSY Bilateral 1998   neg  . carotid artery surgery  12/2013  . COLONOSCOPY  2009, 2013   Dr. Niel HummerIftikhar  . FOOT SURGERY Left   . SALPINGOOPHORECTOMY  1996  . TONSILLECTOMY N/A   . TOTAL ABDOMINAL HYSTERECTOMY N/A   . UPPER GI ENDOSCOPY  2012    Social History Social  History   Tobacco Use  . Smoking status: Former Smoker    Quit date: 02/22/1990    Years since quitting: 30.0  . Smokeless tobacco: Never Used  Substance Use Topics  . Alcohol use: Yes    Alcohol/week: 0.0 standard drinks  . Drug use: No    Family History Family History  Problem Relation Age of Onset  . Breast cancer Maternal Aunt 40  . Diabetes Maternal Aunt   . Breast cancer Paternal Aunt 1360  . Diabetes Paternal Aunt   . Arthritis Mother   . COPD Mother   . Diabetes Mother   . Heart disease Mother   . Hypertension Father   . Alcohol abuse Father   . Depression Father   . Early death Father   . Heart disease Father   . Mental illness Father   . Stroke Father   . Cancer Sister   . Alcohol abuse Brother   . Heart disease Brother   . Early death Paternal Uncle   . Hypertension Paternal Uncle   . Asthma Paternal Grandmother     Allergies  Allergen Reactions  . Benztropine Other (See Comments)  . Catapres  [Clonidine Hcl]     Other reaction(s): Pruritic rash from adhesive  . Lidocaine     Other reaction(s): Pruritic rash from adhesive  . Lithium     Other reaction(s):  Altered mental status (finding)  . Wellbutrin [Bupropion] Hives and Other (See Comments)    rash  . Bacitracin-Polymyxin B Rash  . Latex Rash and Swelling    Other reaction(s): Weal  . Paxil [Paroxetine Hcl] Rash and Other (See Comments)    rash  . Sulfa Antibiotics Rash and Other (See Comments)    rash  . Tape Rash     REVIEW OF SYSTEMS (Negative unless checked)  Constitutional: [] Weight loss  [] Fever  [] Chills Cardiac: [] Chest pain   [] Chest pressure   [] Palpitations   [] Shortness of breath when laying flat   [] Shortness of breath with exertion. Vascular:  [] Pain in legs with walking   [] Pain in legs at rest  [] History of DVT   [] Phlebitis   [] Swelling in legs   [] Varicose veins   [] Non-healing ulcers Pulmonary:   [] Uses home oxygen   [] Productive cough   [] Hemoptysis   [] Wheeze  [] COPD    [] Asthma Neurologic:  [] Dizziness   [] Seizures   [] History of stroke   [] History of TIA  [] Aphasia   [] Vissual changes   [] Weakness or numbness in arm   [] Weakness or numbness in leg Musculoskeletal:   [] Joint swelling   [] Joint pain   [] Low back pain Hematologic:  [] Easy bruising  [] Easy bleeding   [] Hypercoagulable state   [] Anemic Gastrointestinal:  [] Diarrhea   [] Vomiting  [] Gastroesophageal reflux/heartburn   [] Difficulty swallowing. Genitourinary:  [] Chronic kidney disease   [] Difficult urination  [] Frequent urination   [] Blood in urine Skin:  [] Rashes   [] Ulcers  Psychological:  [] History of anxiety   []  History of major depression.  Physical Examination  There were no vitals filed for this visit. There is no height or weight on file to calculate BMI. Gen: WD/WN, NAD Head: [place]/AT, No temporalis wasting.  Ear/Nose/Throat: Hearing grossly intact, nares w/o erythema or drainage Eyes: PER, EOMI, sclera nonicteric.  Neck: Supple, no large masses.   Pulmonary:  Good air movement, no audible wheezing bilaterally, no use of accessory muscles.  Cardiac: RRR, no JVD Vascular: no carotid bruits noted Vessel Right Left  Radial Palpable Palpable  Carotid Palpable Palpable  Gastrointestinal: Non-distended. No guarding/no peritoneal signs.  Musculoskeletal: M/S 5/5 throughout.  No deformity or atrophy.  Neurologic: CN 2-12 intact. Symmetrical.  Speech is fluent. Motor exam as listed above. Psychiatric: Judgment intact, Mood & affect appropriate for pt's clinical situation. Dermatologic: No rashes or ulcers noted.  No changes consistent with cellulitis.   CBC Lab Results  Component Value Date   WBC 16.0 (H) 12/29/2013   HGB 10.1 (L) 12/29/2013   HCT 31.4 (L) 12/29/2013   MCV 94 12/29/2013   PLT 324 12/29/2013    BMET    Component Value Date/Time   NA 138 12/29/2013 0658   K 4.3 12/29/2013 0658   CL 106 12/29/2013 0658   CO2 23 12/29/2013 0658   GLUCOSE 199 (H) 12/29/2013 0658    BUN 13 12/29/2013 0658   CREATININE 0.60 02/19/2020 0937   CREATININE 0.72 12/29/2013 0658   CALCIUM 8.1 (L) 12/29/2013 0658   GFRNONAA >60 12/29/2013 0658   GFRNONAA >60 01/28/2013 2157   GFRAA >60 12/29/2013 0658   GFRAA >60 01/28/2013 2157   CrCl cannot be calculated (Unknown ideal weight.).  COAG Lab Results  Component Value Date   INR 1.0 12/29/2013   INR 0.9 12/19/2013   INR 0.8 09/15/2011    Radiology CT ANGIO HEAD W OR WO CONTRAST  Result Date: 02/19/2020 CLINICAL DATA:  Bilateral  carotid artery stenosis EXAM: CT ANGIOGRAPHY HEAD AND NECK TECHNIQUE: Multidetector CT imaging of the head and neck was performed using the standard protocol during bolus administration of intravenous contrast. Multiplanar CT image reconstructions and MIPs were obtained to evaluate the vascular anatomy. Carotid stenosis measurements (when applicable) are obtained utilizing NASCET criteria, using the distal internal carotid diameter as the denominator. CONTRAST:  96mL OMNIPAQUE IOHEXOL 350 MG/ML SOLN COMPARISON:  Correlation made with MRI head 12/29/2019 FINDINGS: CT HEAD Brain: There is no acute intracranial hemorrhage, mass effect, or edema. Gray-white differentiation is preserved. There is no extra-axial fluid collection. Prominence of the ventricles and sulci reflects generalized parenchymal volume loss. Patchy and confluent areas of hypoattenuation in the supratentorial white matter are nonspecific but probably reflect mild to moderate chronic microvascular ischemic changes. Vascular: There is atherosclerotic calcification at the skull base. Skull: Calvarium is unremarkable. Sinuses/Orbits: No acute finding. Other: None. Review of the MIP images confirms the above findings CTA NECK Aortic arch: Great vessel origins are patent. Right carotid system: Patent. Tortuosity of the proximal ICA. No measurable stenosis at the ICA origin. Left carotid system: Patent. Retropharyngeal course of the common carotid  extending slightly past midline. Mild calcified plaque at the ICA origin without measurable stenosis. Vertebral arteries: Patent and codominant. Mild tortuosity of the proximal left vertebral artery. No measurable stenosis or evidence of dissection. Skeleton: Multilevel degenerative changes of the included spine. Other neck: No mass or adenopathy. Upper chest: No apical lung mass. Review of the MIP images confirms the above findings CTA HEAD Anterior circulation: Intracranial internal carotid arteries are patent with calcified plaque no significant stenosis. Anterior and middle cerebral arteries are patent. Posterior circulation: Intracranial vertebral arteries, basilar artery, and posterior cerebral arteries are patent. Small bilateral posterior communicating arteries are present. Venous sinuses: As permitted by contrast timing, patent. Review of the MIP images confirms the above findings IMPRESSION: No acute intracranial abnormality. Mild to moderate chronic microvascular ischemic changes. No hemodynamically significant stenosis. Electronically Signed   By: Guadlupe Spanish M.D.   On: 02/19/2020 10:09   CT ANGIO NECK W OR WO CONTRAST  Result Date: 02/19/2020 CLINICAL DATA:  Bilateral carotid artery stenosis EXAM: CT ANGIOGRAPHY HEAD AND NECK TECHNIQUE: Multidetector CT imaging of the head and neck was performed using the standard protocol during bolus administration of intravenous contrast. Multiplanar CT image reconstructions and MIPs were obtained to evaluate the vascular anatomy. Carotid stenosis measurements (when applicable) are obtained utilizing NASCET criteria, using the distal internal carotid diameter as the denominator. CONTRAST:  66mL OMNIPAQUE IOHEXOL 350 MG/ML SOLN COMPARISON:  Correlation made with MRI head 12/29/2019 FINDINGS: CT HEAD Brain: There is no acute intracranial hemorrhage, mass effect, or edema. Gray-white differentiation is preserved. There is no extra-axial fluid collection.  Prominence of the ventricles and sulci reflects generalized parenchymal volume loss. Patchy and confluent areas of hypoattenuation in the supratentorial white matter are nonspecific but probably reflect mild to moderate chronic microvascular ischemic changes. Vascular: There is atherosclerotic calcification at the skull base. Skull: Calvarium is unremarkable. Sinuses/Orbits: No acute finding. Other: None. Review of the MIP images confirms the above findings CTA NECK Aortic arch: Great vessel origins are patent. Right carotid system: Patent. Tortuosity of the proximal ICA. No measurable stenosis at the ICA origin. Left carotid system: Patent. Retropharyngeal course of the common carotid extending slightly past midline. Mild calcified plaque at the ICA origin without measurable stenosis. Vertebral arteries: Patent and codominant. Mild tortuosity of the proximal left vertebral artery. No measurable stenosis  or evidence of dissection. Skeleton: Multilevel degenerative changes of the included spine. Other neck: No mass or adenopathy. Upper chest: No apical lung mass. Review of the MIP images confirms the above findings CTA HEAD Anterior circulation: Intracranial internal carotid arteries are patent with calcified plaque no significant stenosis. Anterior and middle cerebral arteries are patent. Posterior circulation: Intracranial vertebral arteries, basilar artery, and posterior cerebral arteries are patent. Small bilateral posterior communicating arteries are present. Venous sinuses: As permitted by contrast timing, patent. Review of the MIP images confirms the above findings IMPRESSION: No acute intracranial abnormality. Mild to moderate chronic microvascular ischemic changes. No hemodynamically significant stenosis. Electronically Signed   By: Macy Mis M.D.   On: 02/19/2020 10:09     Assessment/Plan 1. Bilateral carotid artery stenosis Recommend:  Given the patient's asymptomatic subcritical stenosis no  further invasive testing or surgery at this time.  CT angiogram shows minimal stenosis throughout.  There does not appear to be any vascular involvement with her neurologic deterioration  Continue antiplatelet therapy as prescribed Continue management of CAD, HTN and Hyperlipidemia Healthy heart diet,  encouraged exercise at least 4 times per week  Follow up in prn  2. Essential hypertension Continue antihypertensive medications as already ordered, these medications have been reviewed and there are no changes at this time.   3. COPD, moderate ([place]) Continue pulmonary medications and aerosols as already ordered, these medications have been reviewed and there are no changes at this time.    4. Mixed hyperlipidemia Continue statin as ordered and reviewed, no changes at this time   Hortencia Pilar, MD  02/27/2020 4:40 PM

## 2020-02-28 ENCOUNTER — Encounter (INDEPENDENT_AMBULATORY_CARE_PROVIDER_SITE_OTHER): Payer: Self-pay | Admitting: Vascular Surgery

## 2020-02-28 ENCOUNTER — Ambulatory Visit (INDEPENDENT_AMBULATORY_CARE_PROVIDER_SITE_OTHER): Payer: Medicare Other | Admitting: Vascular Surgery

## 2020-02-28 ENCOUNTER — Other Ambulatory Visit: Payer: Self-pay

## 2020-02-28 VITALS — BP 126/69 | HR 65 | Ht <= 58 in | Wt 126.0 lb

## 2020-02-28 DIAGNOSIS — E782 Mixed hyperlipidemia: Secondary | ICD-10-CM

## 2020-02-28 DIAGNOSIS — J449 Chronic obstructive pulmonary disease, unspecified: Secondary | ICD-10-CM | POA: Diagnosis not present

## 2020-02-28 DIAGNOSIS — I10 Essential (primary) hypertension: Secondary | ICD-10-CM | POA: Diagnosis not present

## 2020-02-28 DIAGNOSIS — I6523 Occlusion and stenosis of bilateral carotid arteries: Secondary | ICD-10-CM | POA: Diagnosis not present

## 2020-03-17 ENCOUNTER — Telehealth: Payer: Self-pay | Admitting: Nurse Practitioner

## 2020-03-17 NOTE — Telephone Encounter (Signed)
Spoke with patient's husband regarding the Palliative referral/services and all questions were answered and he was in agreement with scheduling visit with NP.  I have scheduled an In-person Consult for 03/20/20 @ 3 PM

## 2020-03-20 ENCOUNTER — Encounter: Payer: Self-pay | Admitting: Nurse Practitioner

## 2020-03-20 ENCOUNTER — Other Ambulatory Visit: Payer: Self-pay

## 2020-03-20 ENCOUNTER — Other Ambulatory Visit: Payer: Medicare Other | Admitting: Nurse Practitioner

## 2020-03-20 DIAGNOSIS — F0390 Unspecified dementia without behavioral disturbance: Secondary | ICD-10-CM

## 2020-03-20 NOTE — Progress Notes (Signed)
[facility] Consult Note Telephone: [phone]  Fax: [phone]  PATIENT NAME: Emily Kemp DOB: [birth year] MRN: [id number]  PRIMARY CARE PROVIDER:   Jodi Marble, MD  REFERRING PROVIDER:  Jodi Marble, MD [place],  [place] [address]  RESPONSIBLE PARTY:   Husband, Harneet Noblett  I was asked by Dr Werner Lean to see Ms. Kemp for Palliative care consult for Complex medical decision making  1. Advance Care Planning; DNR, placed in vynca. Waiting for completion of LP to proceed with Hospice. Will have Hospice Physicians review case.   2. Goals of Care: Goals include to maximize quality of life and symptom management. Our advance care planning conversation included a discussion about:     The value and importance of advance care planning   Exploration of personal, cultural or spiritual beliefs that might influence medical decisions   Exploration of goals of care in the event of a sudden injury or illness   Identification and preparation of a healthcare agent   Review and updating or creation of an  advance directive document.   3. Palliative care encounter; Palliative care encounter; Palliative medicine team will continue to support patient, patient's family, and medical team. Visit consisted of counseling and education dealing with the complex and emotionally intense issues of symptom management and palliative care in the setting of serious and potentially life-threatening illness   4. f/u 1 month for ongoing monitoring chronic disease progression, ongoing discussions complex medical decision making  I spent 90 minutes providing this consultation,  from 3:00pm to 4:30pm. More than 50% of the time in this consultation was spent coordinating communication.   HISTORY OF PRESENT ILLNESS:  ARNETTE DRIGGS is a 80 y.o. 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 I called Mr. Racca to confirm in-person Palliative care visit and covid screening which was negative. Mr. Schreffler in agreement to Palliative appointment. I arrived at Mr. [place] home. Emily Kemp was lying in a hospital bed in her room. We talked about the last time Emily Kemp was independent she previously worked as a Equities trader more than five years ago. Emily Kemp has overall decline due to degeneration in her spine. We talked about disease progression. We talked about recent events over the last six months and more specifically the last three. We talked about her current functional level where she is able to stand at times and continues to fall frequently. Mr. Broussard endorses they have a lift that they use. We talked about Emily Kemp requiring total ADL care. Emily Kemp does continue to have sensations when she needs to urinate or have a bowel movement. We talked about Emily Kemp is still able to feed herself at times though appetite has remained declined. We talked about episodes of Tremors. Emily Kemp endorses Emily Kemp had an episode this morning that lasted quite some time where she shook all over her arms, legs and bottom lip quivered for hours then stopped. Mr. Savitt talked about intermittent times of more confusion. Mr. Stuckert endorses today seems to be a good day Emily Kemp is more clear. We talked about medical goals of care with witches for comfort. Living Well reviewed. We talked about code status as wishes are for DNR. [place] form completed will  play something and gave to Mr. Pascal to keep at home. We talked about overall progression and concern for CJC. We talked about MRI that was done yesterday with pending results. We  talked about LP scheduled for the end of February. We talked about role of Palliative care and plan of care. We talked about Hospice benefit through Medicare. We talked about what services are provided. We talked about Dr. Werner Lean neurologist and conserve for rapid disease progression. We talked about seeing if we could move the LP sooner so we can initiate Hospice Services. Will contact after Dr Werner Lean to see if that is an option. We talked about Hospice philosophy and goals of care which is a line to what Mr. and Emily. Bubar wish to have. We talked about family dynamics says they had three boys one which has since passed and married for over 62 years. We talked about the challenges, fears and uncertainty of the next few months which will result in Emily Cull[place]urg passing. We talked about coping strategies. We talked about quality of life. Mr and Emily Patrice Paradise talked about their grandchildren for which they have many now. Therapeutic listening and emotional support provided. Contact information. Questions answered to satisfaction.   Palliative Care was asked to help address goals of care.   CODE STATUS: DNR  PPS: 40% HOSPICE ELIGIBILITY/DIAGNOSIS: TBD  PAST MEDICAL HISTORY:  Past Medical History:  Diagnosis Date  . Arthritis   . Asthma   . Barrett esophagus   . Benign neoplasm of skin of trunk, except scrotum   . Breast screening, unspecified   . COPD (chronic obstructive pulmonary disease) ([place])   . Cystitis   . Degenerative disc disease, lumbar   . GERD (gastroesophageal reflux disease)   . Hypertension   . Lumbar herniated disc   . Morbid obesity ([place])   . Osteoporosis   . Psoriasis   . Scoliosis   . Screening for obesity   . Sleep apnea   . Torn meniscus 2014   right knee    SOCIAL HX:  Social History   Tobacco Use  . Smoking status: Former Smoker    Quit date: 02/22/1990    Years since quitting: 30.0  . Smokeless tobacco: Never Used  Substance Use Topics  . Alcohol use: Yes     Alcohol/week: 0.0 standard drinks    ALLERGIES:  Allergies  Allergen Reactions  . Benztropine Other (See Comments)  . Catapres  [Clonidine Hcl]     Other reaction(s): Pruritic rash from adhesive  . Lidocaine     Other reaction(s): Pruritic rash from adhesive  . Lithium     Other reaction(s): Altered mental status (finding)  . Wellbutrin [Bupropion] Hives and Other (See Comments)    rash  . Bacitracin-Polymyxin B Rash  . Latex Rash and Swelling    Other reaction(s): Weal  . Paxil [Paroxetine Hcl] Rash and Other (See Comments)    rash  . Sulfa Antibiotics Rash and Other (See Comments)    rash  . Tape Rash     PERTINENT MEDICATIONS:  Outpatient Encounter Medications as of 03/20/2020  Medication Sig  . albuterol (VENTOLIN HFA) 108 (90 Base) MCG/ACT inhaler Inhale into the lungs.  Marland Kitchen amLODipine-valsartan (EXFORGE) 10-320 MG per tablet Take 1 tablet by mouth 2 (two) times daily. 1/2 in the morning and 1/2 in the evening  . arformoterol (BROVANA) 15 MCG/2ML NEBU Inhale into the lungs.  Marland Kitchen aspirin 325 MG tablet Take 325 mg by mouth daily.  Marland Kitchen  atorvastatin (LIPITOR) 20 MG tablet Take 20 mg by mouth daily.  Marland Kitchen atorvastatin (LIPITOR) 40 MG tablet Take 40 mg by mouth at bedtime.  Marland Kitchen azelastine (ASTELIN) 0.1 % nasal spray Place into both nostrils 2 (two) times daily. Use in each nostril as directed  . Benzonatate (TESSALON PERLES PO) Take by mouth. Pt doesn't know dose  Takes as needed  . budesonide (PULMICORT) 0.5 MG/2ML nebulizer solution Inhale into the lungs.  . cloNIDine (CATAPRES) 0.2 MG tablet Take 0.2 mg by mouth 2 (two) times daily.   Marland Kitchen conjugated estrogens (PREMARIN) vaginal cream Place 1 Applicatorful vaginally as needed.  . diclofenac sodium (VOLTAREN) 1 % GEL I a maximum of 4 g per application to painful area of skin 4 times per day if tolerated  . DULoxetine (CYMBALTA) 60 MG capsule Take 60 mg by mouth 2 (two) times daily.   . fexofenadine (ALLEGRA) 180 MG tablet Take 180 mg by  mouth daily. Reported on 07/31/2015  . fluocinonide cream (LIDEX) [id number] % Apply 1 application topically as needed. Reported on 04/16/2015  . fluticasone (VERAMYST) 27.5 MCG/SPRAY nasal spray Place 1 spray into the nose daily. Reported on 07/03/2015  . fluticasone furoate-vilanterol (BREO ELLIPTA) 100-25 MCG/INH AEPB Inhale into the lungs.  Marland Kitchen HYDROcodone-acetaminophen (NORCO) 7.5-325 MG tablet Limit 1 tablet by mouth per day or twice a day if tolerated  . hydrocortisone 2.5 % cream SMARTSIG:1 Topical Every Night  . lamoTRIgine (LAMICTAL) 200 MG tablet Take 200 mg by mouth 2 (two) times daily. Reported on 07/31/2015  . LORazepam (ATIVAN) 1 MG tablet Take 1 mg by mouth daily as needed.  . meloxicam (MOBIC) 7.5 MG tablet Limit 1 tablet by mouth per day or every other day if tolerated  . memantine (NAMENDA) 5 MG tablet Take by mouth.  . metFORMIN (GLUCOPHAGE) 1000 MG tablet Take 1,000 mg by mouth 2 (two) times daily.  . metFORMIN (GLUCOPHAGE) 500 MG tablet Take 1,000 mg by mouth 2 (two) times daily.   . methadone (DOLOPHINE) 5 MG tablet Limit one half tab by mouth per day or every 12 hours if tolerated  . methocarbamol (ROBAXIN) 500 MG tablet Limit 1-3 tablets by mouth per day if tolerated  . metoprolol (LOPRESSOR) 50 MG tablet Take 50 mg by mouth 2 (two) times daily.  . pantoprazole (PROTONIX) 40 MG tablet Take 40 mg by mouth daily.  . traZODone (DESYREL) 100 MG tablet Take 300 mg by mouth at bedtime.   . triamcinolone cream (KENALOG) 0.5 % Apply 1 application topically as needed.  . umeclidinium bromide (INCRUSE ELLIPTA) 62.5 MCG/INH AEPB Inhale into the lungs.   No facility-administered encounter medications on file as of 03/20/2020.    PHYSICAL EXAM:   General: debilitated, weak, pleasant female, some confusion Cardiovascular: regular rate and rhythm Pulmonary: clear ant fields Neurological: severe weakness  Christin Ihor Gully, NP

## 2020-04-22 DEATH — deceased

## 2021-12-21 ENCOUNTER — Encounter (INDEPENDENT_AMBULATORY_CARE_PROVIDER_SITE_OTHER): Payer: Self-pay
